# Patient Record
Sex: Female | Born: 1950 | Race: White | Hispanic: No | Marital: Single | Smoking: Never smoker
Health system: Southern US, Community
[De-identification: ages and names within clinical notes are randomized; demographics above are authoritative.]

## PROBLEM LIST (undated history)

## (undated) DIAGNOSIS — A6000 Herpesviral infection of urogenital system, unspecified: Secondary | ICD-10-CM

## (undated) DIAGNOSIS — M25549 Pain in joints of unspecified hand: Secondary | ICD-10-CM

## (undated) DIAGNOSIS — E039 Hypothyroidism, unspecified: Secondary | ICD-10-CM

## (undated) DIAGNOSIS — I10 Essential (primary) hypertension: Secondary | ICD-10-CM

## (undated) DIAGNOSIS — I219 Acute myocardial infarction, unspecified: Secondary | ICD-10-CM

## (undated) HISTORY — DX: Pain in joints of unspecified hand: M25.549

## (undated) HISTORY — DX: Essential (primary) hypertension: I10

## (undated) HISTORY — PX: TONSILLECTOMY AND ADENOIDECTOMY: SUR1326

## (undated) HISTORY — PX: ABDOMINAL HYSTERECTOMY: SHX81

## (undated) HISTORY — DX: Hypothyroidism, unspecified: E03.9

## (undated) HISTORY — DX: Herpesviral infection of urogenital system, unspecified: A60.00

---

## 2015-04-21 DIAGNOSIS — Z1231 Encounter for screening mammogram for malignant neoplasm of breast: Secondary | ICD-10-CM | POA: Diagnosis not present

## 2015-09-20 DIAGNOSIS — Z23 Encounter for immunization: Secondary | ICD-10-CM | POA: Diagnosis not present

## 2015-09-20 DIAGNOSIS — G8929 Other chronic pain: Secondary | ICD-10-CM | POA: Diagnosis not present

## 2015-09-20 DIAGNOSIS — E039 Hypothyroidism, unspecified: Secondary | ICD-10-CM | POA: Diagnosis not present

## 2015-09-20 DIAGNOSIS — Z79899 Other long term (current) drug therapy: Secondary | ICD-10-CM | POA: Diagnosis not present

## 2015-09-20 DIAGNOSIS — A6000 Herpesviral infection of urogenital system, unspecified: Secondary | ICD-10-CM | POA: Diagnosis not present

## 2015-09-20 DIAGNOSIS — M2550 Pain in unspecified joint: Secondary | ICD-10-CM | POA: Diagnosis not present

## 2015-09-20 DIAGNOSIS — Z0000 Encounter for general adult medical examination without abnormal findings: Secondary | ICD-10-CM | POA: Diagnosis not present

## 2016-01-09 DIAGNOSIS — H5213 Myopia, bilateral: Secondary | ICD-10-CM | POA: Diagnosis not present

## 2016-08-06 DIAGNOSIS — L209 Atopic dermatitis, unspecified: Secondary | ICD-10-CM | POA: Diagnosis not present

## 2016-09-20 DIAGNOSIS — M2550 Pain in unspecified joint: Secondary | ICD-10-CM | POA: Diagnosis not present

## 2016-09-20 DIAGNOSIS — Z0000 Encounter for general adult medical examination without abnormal findings: Secondary | ICD-10-CM | POA: Diagnosis not present

## 2016-09-20 DIAGNOSIS — G8929 Other chronic pain: Secondary | ICD-10-CM | POA: Diagnosis not present

## 2016-09-20 DIAGNOSIS — Z23 Encounter for immunization: Secondary | ICD-10-CM | POA: Diagnosis not present

## 2016-09-20 DIAGNOSIS — Z79899 Other long term (current) drug therapy: Secondary | ICD-10-CM | POA: Diagnosis not present

## 2016-09-20 DIAGNOSIS — E039 Hypothyroidism, unspecified: Secondary | ICD-10-CM | POA: Diagnosis not present

## 2016-10-01 DIAGNOSIS — H2513 Age-related nuclear cataract, bilateral: Secondary | ICD-10-CM | POA: Diagnosis not present

## 2016-10-09 DIAGNOSIS — Z1231 Encounter for screening mammogram for malignant neoplasm of breast: Secondary | ICD-10-CM | POA: Diagnosis not present

## 2016-10-22 DIAGNOSIS — M25549 Pain in joints of unspecified hand: Secondary | ICD-10-CM | POA: Diagnosis not present

## 2016-11-14 DIAGNOSIS — H2513 Age-related nuclear cataract, bilateral: Secondary | ICD-10-CM | POA: Diagnosis not present

## 2016-11-14 DIAGNOSIS — Z01818 Encounter for other preprocedural examination: Secondary | ICD-10-CM | POA: Diagnosis not present

## 2016-11-14 DIAGNOSIS — H2511 Age-related nuclear cataract, right eye: Secondary | ICD-10-CM | POA: Diagnosis not present

## 2016-11-14 DIAGNOSIS — H25811 Combined forms of age-related cataract, right eye: Secondary | ICD-10-CM | POA: Diagnosis not present

## 2016-11-25 DIAGNOSIS — H25812 Combined forms of age-related cataract, left eye: Secondary | ICD-10-CM | POA: Diagnosis not present

## 2016-11-25 DIAGNOSIS — H2181 Floppy iris syndrome: Secondary | ICD-10-CM | POA: Diagnosis not present

## 2016-11-25 DIAGNOSIS — H2513 Age-related nuclear cataract, bilateral: Secondary | ICD-10-CM | POA: Diagnosis not present

## 2016-11-25 DIAGNOSIS — H2512 Age-related nuclear cataract, left eye: Secondary | ICD-10-CM | POA: Diagnosis not present

## 2016-12-13 DIAGNOSIS — J189 Pneumonia, unspecified organism: Secondary | ICD-10-CM | POA: Diagnosis not present

## 2017-07-03 DIAGNOSIS — H524 Presbyopia: Secondary | ICD-10-CM | POA: Diagnosis not present

## 2017-07-03 DIAGNOSIS — H4303 Vitreous prolapse, bilateral: Secondary | ICD-10-CM | POA: Diagnosis not present

## 2017-07-11 DIAGNOSIS — Z6824 Body mass index (BMI) 24.0-24.9, adult: Secondary | ICD-10-CM | POA: Diagnosis not present

## 2017-07-11 DIAGNOSIS — Z79899 Other long term (current) drug therapy: Secondary | ICD-10-CM | POA: Diagnosis not present

## 2017-07-11 DIAGNOSIS — R208 Other disturbances of skin sensation: Secondary | ICD-10-CM | POA: Diagnosis not present

## 2017-07-11 DIAGNOSIS — E039 Hypothyroidism, unspecified: Secondary | ICD-10-CM | POA: Diagnosis not present

## 2017-07-23 DIAGNOSIS — Z6824 Body mass index (BMI) 24.0-24.9, adult: Secondary | ICD-10-CM | POA: Diagnosis not present

## 2017-07-23 DIAGNOSIS — R208 Other disturbances of skin sensation: Secondary | ICD-10-CM | POA: Diagnosis not present

## 2017-07-23 DIAGNOSIS — M5431 Sciatica, right side: Secondary | ICD-10-CM | POA: Diagnosis not present

## 2017-08-14 DIAGNOSIS — J209 Acute bronchitis, unspecified: Secondary | ICD-10-CM | POA: Diagnosis not present

## 2017-08-14 DIAGNOSIS — Z6824 Body mass index (BMI) 24.0-24.9, adult: Secondary | ICD-10-CM | POA: Diagnosis not present

## 2017-09-12 DIAGNOSIS — Z23 Encounter for immunization: Secondary | ICD-10-CM | POA: Diagnosis not present

## 2017-10-10 DIAGNOSIS — Z1231 Encounter for screening mammogram for malignant neoplasm of breast: Secondary | ICD-10-CM | POA: Diagnosis not present

## 2017-11-24 DIAGNOSIS — Z6824 Body mass index (BMI) 24.0-24.9, adult: Secondary | ICD-10-CM | POA: Diagnosis not present

## 2017-11-24 DIAGNOSIS — J209 Acute bronchitis, unspecified: Secondary | ICD-10-CM | POA: Diagnosis not present

## 2017-11-24 DIAGNOSIS — J40 Bronchitis, not specified as acute or chronic: Secondary | ICD-10-CM | POA: Diagnosis not present

## 2017-12-04 DIAGNOSIS — J8489 Other specified interstitial pulmonary diseases: Secondary | ICD-10-CM | POA: Diagnosis not present

## 2017-12-04 DIAGNOSIS — J209 Acute bronchitis, unspecified: Secondary | ICD-10-CM | POA: Diagnosis not present

## 2017-12-04 DIAGNOSIS — Z6824 Body mass index (BMI) 24.0-24.9, adult: Secondary | ICD-10-CM | POA: Diagnosis not present

## 2017-12-25 DIAGNOSIS — J40 Bronchitis, not specified as acute or chronic: Secondary | ICD-10-CM | POA: Diagnosis not present

## 2018-01-25 DIAGNOSIS — J40 Bronchitis, not specified as acute or chronic: Secondary | ICD-10-CM | POA: Diagnosis not present

## 2018-02-22 DIAGNOSIS — J40 Bronchitis, not specified as acute or chronic: Secondary | ICD-10-CM | POA: Diagnosis not present

## 2018-03-25 DIAGNOSIS — J40 Bronchitis, not specified as acute or chronic: Secondary | ICD-10-CM | POA: Diagnosis not present

## 2018-04-24 DIAGNOSIS — J40 Bronchitis, not specified as acute or chronic: Secondary | ICD-10-CM | POA: Diagnosis not present

## 2018-05-25 DIAGNOSIS — J40 Bronchitis, not specified as acute or chronic: Secondary | ICD-10-CM | POA: Diagnosis not present

## 2018-06-24 DIAGNOSIS — J40 Bronchitis, not specified as acute or chronic: Secondary | ICD-10-CM | POA: Diagnosis not present

## 2018-07-25 DIAGNOSIS — J40 Bronchitis, not specified as acute or chronic: Secondary | ICD-10-CM | POA: Diagnosis not present

## 2018-08-05 DIAGNOSIS — L578 Other skin changes due to chronic exposure to nonionizing radiation: Secondary | ICD-10-CM | POA: Diagnosis not present

## 2018-08-05 DIAGNOSIS — D485 Neoplasm of uncertain behavior of skin: Secondary | ICD-10-CM | POA: Diagnosis not present

## 2018-08-06 DIAGNOSIS — H524 Presbyopia: Secondary | ICD-10-CM | POA: Diagnosis not present

## 2018-08-06 DIAGNOSIS — H4303 Vitreous prolapse, bilateral: Secondary | ICD-10-CM | POA: Diagnosis not present

## 2018-08-25 DIAGNOSIS — J40 Bronchitis, not specified as acute or chronic: Secondary | ICD-10-CM | POA: Diagnosis not present

## 2018-09-06 DIAGNOSIS — S82192A Other fracture of upper end of left tibia, initial encounter for closed fracture: Secondary | ICD-10-CM | POA: Diagnosis not present

## 2018-09-06 DIAGNOSIS — S82255A Nondisplaced comminuted fracture of shaft of left tibia, initial encounter for closed fracture: Secondary | ICD-10-CM | POA: Diagnosis not present

## 2018-09-06 DIAGNOSIS — S82002A Unspecified fracture of left patella, initial encounter for closed fracture: Secondary | ICD-10-CM | POA: Diagnosis not present

## 2018-09-06 DIAGNOSIS — S82092A Other fracture of left patella, initial encounter for closed fracture: Secondary | ICD-10-CM | POA: Diagnosis not present

## 2018-09-06 DIAGNOSIS — S82102A Unspecified fracture of upper end of left tibia, initial encounter for closed fracture: Secondary | ICD-10-CM | POA: Diagnosis not present

## 2018-09-06 DIAGNOSIS — S82202A Unspecified fracture of shaft of left tibia, initial encounter for closed fracture: Secondary | ICD-10-CM | POA: Diagnosis not present

## 2018-09-07 DIAGNOSIS — S82035A Nondisplaced transverse fracture of left patella, initial encounter for closed fracture: Secondary | ICD-10-CM | POA: Diagnosis not present

## 2018-09-07 DIAGNOSIS — S82192A Other fracture of upper end of left tibia, initial encounter for closed fracture: Secondary | ICD-10-CM | POA: Diagnosis not present

## 2018-09-14 DIAGNOSIS — S82035A Nondisplaced transverse fracture of left patella, initial encounter for closed fracture: Secondary | ICD-10-CM | POA: Diagnosis not present

## 2018-09-14 DIAGNOSIS — S82192A Other fracture of upper end of left tibia, initial encounter for closed fracture: Secondary | ICD-10-CM | POA: Diagnosis not present

## 2018-09-21 DIAGNOSIS — S82192D Other fracture of upper end of left tibia, subsequent encounter for closed fracture with routine healing: Secondary | ICD-10-CM | POA: Diagnosis not present

## 2018-09-21 DIAGNOSIS — S82035D Nondisplaced transverse fracture of left patella, subsequent encounter for closed fracture with routine healing: Secondary | ICD-10-CM | POA: Diagnosis not present

## 2018-10-05 DIAGNOSIS — M85851 Other specified disorders of bone density and structure, right thigh: Secondary | ICD-10-CM | POA: Diagnosis not present

## 2018-10-05 DIAGNOSIS — Z13820 Encounter for screening for osteoporosis: Secondary | ICD-10-CM | POA: Diagnosis not present

## 2018-10-05 DIAGNOSIS — Z23 Encounter for immunization: Secondary | ICD-10-CM | POA: Diagnosis not present

## 2018-10-14 DIAGNOSIS — S82192D Other fracture of upper end of left tibia, subsequent encounter for closed fracture with routine healing: Secondary | ICD-10-CM | POA: Diagnosis not present

## 2018-10-14 DIAGNOSIS — S82035D Nondisplaced transverse fracture of left patella, subsequent encounter for closed fracture with routine healing: Secondary | ICD-10-CM | POA: Diagnosis not present

## 2018-10-20 DIAGNOSIS — M25562 Pain in left knee: Secondary | ICD-10-CM | POA: Diagnosis not present

## 2018-10-20 DIAGNOSIS — R262 Difficulty in walking, not elsewhere classified: Secondary | ICD-10-CM | POA: Diagnosis not present

## 2018-10-22 DIAGNOSIS — M25562 Pain in left knee: Secondary | ICD-10-CM | POA: Diagnosis not present

## 2018-10-22 DIAGNOSIS — R262 Difficulty in walking, not elsewhere classified: Secondary | ICD-10-CM | POA: Diagnosis not present

## 2018-10-26 DIAGNOSIS — R262 Difficulty in walking, not elsewhere classified: Secondary | ICD-10-CM | POA: Diagnosis not present

## 2018-10-26 DIAGNOSIS — M25562 Pain in left knee: Secondary | ICD-10-CM | POA: Diagnosis not present

## 2018-10-29 DIAGNOSIS — R262 Difficulty in walking, not elsewhere classified: Secondary | ICD-10-CM | POA: Diagnosis not present

## 2018-10-29 DIAGNOSIS — M25562 Pain in left knee: Secondary | ICD-10-CM | POA: Diagnosis not present

## 2018-11-02 DIAGNOSIS — R262 Difficulty in walking, not elsewhere classified: Secondary | ICD-10-CM | POA: Diagnosis not present

## 2018-11-02 DIAGNOSIS — M25562 Pain in left knee: Secondary | ICD-10-CM | POA: Diagnosis not present

## 2018-11-06 DIAGNOSIS — M25562 Pain in left knee: Secondary | ICD-10-CM | POA: Diagnosis not present

## 2018-11-06 DIAGNOSIS — R262 Difficulty in walking, not elsewhere classified: Secondary | ICD-10-CM | POA: Diagnosis not present

## 2018-11-09 DIAGNOSIS — R262 Difficulty in walking, not elsewhere classified: Secondary | ICD-10-CM | POA: Diagnosis not present

## 2018-11-09 DIAGNOSIS — M25562 Pain in left knee: Secondary | ICD-10-CM | POA: Diagnosis not present

## 2018-11-09 DIAGNOSIS — S82192D Other fracture of upper end of left tibia, subsequent encounter for closed fracture with routine healing: Secondary | ICD-10-CM | POA: Diagnosis not present

## 2018-11-09 DIAGNOSIS — S82035D Nondisplaced transverse fracture of left patella, subsequent encounter for closed fracture with routine healing: Secondary | ICD-10-CM | POA: Diagnosis not present

## 2018-11-09 DIAGNOSIS — M1732 Unilateral post-traumatic osteoarthritis, left knee: Secondary | ICD-10-CM | POA: Diagnosis not present

## 2018-11-12 DIAGNOSIS — M25562 Pain in left knee: Secondary | ICD-10-CM | POA: Diagnosis not present

## 2018-11-12 DIAGNOSIS — R262 Difficulty in walking, not elsewhere classified: Secondary | ICD-10-CM | POA: Diagnosis not present

## 2018-11-16 DIAGNOSIS — R262 Difficulty in walking, not elsewhere classified: Secondary | ICD-10-CM | POA: Diagnosis not present

## 2018-11-16 DIAGNOSIS — M25562 Pain in left knee: Secondary | ICD-10-CM | POA: Diagnosis not present

## 2018-11-19 DIAGNOSIS — R262 Difficulty in walking, not elsewhere classified: Secondary | ICD-10-CM | POA: Diagnosis not present

## 2018-11-19 DIAGNOSIS — M25562 Pain in left knee: Secondary | ICD-10-CM | POA: Diagnosis not present

## 2018-11-24 DIAGNOSIS — R262 Difficulty in walking, not elsewhere classified: Secondary | ICD-10-CM | POA: Diagnosis not present

## 2018-11-24 DIAGNOSIS — M25562 Pain in left knee: Secondary | ICD-10-CM | POA: Diagnosis not present

## 2018-11-27 DIAGNOSIS — R262 Difficulty in walking, not elsewhere classified: Secondary | ICD-10-CM | POA: Diagnosis not present

## 2018-11-27 DIAGNOSIS — M25562 Pain in left knee: Secondary | ICD-10-CM | POA: Diagnosis not present

## 2018-11-30 DIAGNOSIS — R262 Difficulty in walking, not elsewhere classified: Secondary | ICD-10-CM | POA: Diagnosis not present

## 2018-11-30 DIAGNOSIS — M25562 Pain in left knee: Secondary | ICD-10-CM | POA: Diagnosis not present

## 2018-12-03 DIAGNOSIS — M25562 Pain in left knee: Secondary | ICD-10-CM | POA: Diagnosis not present

## 2018-12-03 DIAGNOSIS — R262 Difficulty in walking, not elsewhere classified: Secondary | ICD-10-CM | POA: Diagnosis not present

## 2018-12-08 DIAGNOSIS — M25562 Pain in left knee: Secondary | ICD-10-CM | POA: Diagnosis not present

## 2018-12-08 DIAGNOSIS — R262 Difficulty in walking, not elsewhere classified: Secondary | ICD-10-CM | POA: Diagnosis not present

## 2018-12-10 DIAGNOSIS — M25562 Pain in left knee: Secondary | ICD-10-CM | POA: Diagnosis not present

## 2018-12-10 DIAGNOSIS — R262 Difficulty in walking, not elsewhere classified: Secondary | ICD-10-CM | POA: Diagnosis not present

## 2018-12-22 DIAGNOSIS — M25562 Pain in left knee: Secondary | ICD-10-CM | POA: Diagnosis not present

## 2018-12-22 DIAGNOSIS — R262 Difficulty in walking, not elsewhere classified: Secondary | ICD-10-CM | POA: Diagnosis not present

## 2018-12-24 DIAGNOSIS — M1732 Unilateral post-traumatic osteoarthritis, left knee: Secondary | ICD-10-CM | POA: Diagnosis not present

## 2018-12-24 DIAGNOSIS — S82192D Other fracture of upper end of left tibia, subsequent encounter for closed fracture with routine healing: Secondary | ICD-10-CM | POA: Diagnosis not present

## 2018-12-24 DIAGNOSIS — S82035D Nondisplaced transverse fracture of left patella, subsequent encounter for closed fracture with routine healing: Secondary | ICD-10-CM | POA: Diagnosis not present

## 2018-12-26 DIAGNOSIS — Z1231 Encounter for screening mammogram for malignant neoplasm of breast: Secondary | ICD-10-CM | POA: Diagnosis not present

## 2018-12-29 DIAGNOSIS — R8271 Bacteriuria: Secondary | ICD-10-CM | POA: Diagnosis not present

## 2018-12-29 DIAGNOSIS — E039 Hypothyroidism, unspecified: Secondary | ICD-10-CM | POA: Diagnosis not present

## 2018-12-29 DIAGNOSIS — R262 Difficulty in walking, not elsewhere classified: Secondary | ICD-10-CM | POA: Diagnosis not present

## 2018-12-29 DIAGNOSIS — E785 Hyperlipidemia, unspecified: Secondary | ICD-10-CM | POA: Diagnosis not present

## 2018-12-29 DIAGNOSIS — M25562 Pain in left knee: Secondary | ICD-10-CM | POA: Diagnosis not present

## 2018-12-29 DIAGNOSIS — E7800 Pure hypercholesterolemia, unspecified: Secondary | ICD-10-CM | POA: Diagnosis not present

## 2018-12-29 DIAGNOSIS — I10 Essential (primary) hypertension: Secondary | ICD-10-CM | POA: Diagnosis not present

## 2018-12-29 DIAGNOSIS — Z0000 Encounter for general adult medical examination without abnormal findings: Secondary | ICD-10-CM | POA: Diagnosis not present

## 2018-12-29 DIAGNOSIS — Z79899 Other long term (current) drug therapy: Secondary | ICD-10-CM | POA: Diagnosis not present

## 2019-01-14 DIAGNOSIS — Z6823 Body mass index (BMI) 23.0-23.9, adult: Secondary | ICD-10-CM | POA: Diagnosis not present

## 2019-01-14 DIAGNOSIS — J22 Unspecified acute lower respiratory infection: Secondary | ICD-10-CM | POA: Diagnosis not present

## 2019-01-28 DIAGNOSIS — Z6823 Body mass index (BMI) 23.0-23.9, adult: Secondary | ICD-10-CM | POA: Diagnosis not present

## 2019-03-22 DIAGNOSIS — Z6822 Body mass index (BMI) 22.0-22.9, adult: Secondary | ICD-10-CM | POA: Diagnosis not present

## 2019-03-22 DIAGNOSIS — R9389 Abnormal findings on diagnostic imaging of other specified body structures: Secondary | ICD-10-CM | POA: Diagnosis not present

## 2019-03-22 DIAGNOSIS — J301 Allergic rhinitis due to pollen: Secondary | ICD-10-CM | POA: Diagnosis not present

## 2019-09-03 DIAGNOSIS — E785 Hyperlipidemia, unspecified: Secondary | ICD-10-CM | POA: Diagnosis not present

## 2019-09-03 DIAGNOSIS — Z79899 Other long term (current) drug therapy: Secondary | ICD-10-CM | POA: Diagnosis not present

## 2019-09-03 DIAGNOSIS — E039 Hypothyroidism, unspecified: Secondary | ICD-10-CM | POA: Diagnosis not present

## 2019-09-14 DIAGNOSIS — E039 Hypothyroidism, unspecified: Secondary | ICD-10-CM | POA: Diagnosis not present

## 2019-09-14 DIAGNOSIS — Z23 Encounter for immunization: Secondary | ICD-10-CM | POA: Diagnosis not present

## 2019-09-14 DIAGNOSIS — Z6823 Body mass index (BMI) 23.0-23.9, adult: Secondary | ICD-10-CM | POA: Diagnosis not present

## 2019-09-14 DIAGNOSIS — I10 Essential (primary) hypertension: Secondary | ICD-10-CM | POA: Diagnosis not present

## 2020-01-24 DIAGNOSIS — R0789 Other chest pain: Secondary | ICD-10-CM | POA: Diagnosis not present

## 2020-01-24 DIAGNOSIS — R9431 Abnormal electrocardiogram [ECG] [EKG]: Secondary | ICD-10-CM | POA: Diagnosis not present

## 2020-01-24 DIAGNOSIS — Z6824 Body mass index (BMI) 24.0-24.9, adult: Secondary | ICD-10-CM | POA: Diagnosis not present

## 2020-01-26 ENCOUNTER — Other Ambulatory Visit: Payer: Self-pay

## 2020-01-26 ENCOUNTER — Ambulatory Visit (INDEPENDENT_AMBULATORY_CARE_PROVIDER_SITE_OTHER): Payer: Medicare Other | Admitting: Cardiology

## 2020-01-26 ENCOUNTER — Encounter: Payer: Self-pay | Admitting: Cardiology

## 2020-01-26 ENCOUNTER — Encounter: Payer: Self-pay | Admitting: *Deleted

## 2020-01-26 VITALS — BP 150/90 | HR 84 | Ht 62.5 in | Wt 134.0 lb

## 2020-01-26 DIAGNOSIS — I10 Essential (primary) hypertension: Secondary | ICD-10-CM | POA: Diagnosis not present

## 2020-01-26 DIAGNOSIS — R072 Precordial pain: Secondary | ICD-10-CM | POA: Insufficient documentation

## 2020-01-26 DIAGNOSIS — M25549 Pain in joints of unspecified hand: Secondary | ICD-10-CM | POA: Insufficient documentation

## 2020-01-26 DIAGNOSIS — E039 Hypothyroidism, unspecified: Secondary | ICD-10-CM | POA: Insufficient documentation

## 2020-01-26 DIAGNOSIS — A6000 Herpesviral infection of urogenital system, unspecified: Secondary | ICD-10-CM

## 2020-01-26 DIAGNOSIS — R011 Cardiac murmur, unspecified: Secondary | ICD-10-CM | POA: Insufficient documentation

## 2020-01-26 DIAGNOSIS — E782 Mixed hyperlipidemia: Secondary | ICD-10-CM

## 2020-01-26 HISTORY — DX: Mixed hyperlipidemia: E78.2

## 2020-01-26 HISTORY — DX: Cardiac murmur, unspecified: R01.1

## 2020-01-26 HISTORY — DX: Precordial pain: R07.2

## 2020-01-26 MED ORDER — NITROGLYCERIN 0.4 MG SL SUBL: 0.4000 mg | SUBLINGUAL_TABLET | SUBLINGUAL | 3 refills | Status: DC | PRN

## 2020-01-26 NOTE — Patient Instructions (Signed)
Medication Instructions:  Your physician has recommended you make the following change in your medication:   Nitroglycerin 0.4 mg sublingual (under your tongue) as needed for chest pain. If experiencing chest pain, stop what you are doing and sit down. Take 1 nitroglycerin and wait 5 minutes. If chest pain continues, take another nitroglycerin and wait 5 minutes. If chest pain does not subside, take 1 more nitroglycerin and dial 911. You make take a total of 3 nitroglycerin in a 15 minute time frame.    *If you need a refill on your cardiac medications before your next appointment, please call your pharmacy*  Lab Work: None If you have labs (blood work) drawn today and your tests are completely normal, you will receive your results only by: Marland Kitchen MyChart Message (if you have MyChart) OR . A paper copy in the mail If you have any lab test that is abnormal or we need to change your treatment, we will call you to review the results.  Testing/Procedures: Your physician has requested that you have an echocardiogram. Echocardiography is a painless test that uses sound waves to create images of your heart. It provides your doctor with information about the size and shape of your heart and how well your heart's chambers and valves are working. This procedure takes approximately one hour. There are no restrictions for this procedure.  Your physician has requested that you have a lexiscan myoview. For further information please visit [email or web address]. Please follow instruction sheet, as given.    Follow-Up: At [facility], you and your health needs are our priority.  As part of our continuing mission to provide you with exceptional heart care, we have created designated Provider Care Teams.  These Care Teams include your primary Cardiologist (physician) and Advanced Practice Providers (APPs -  Physician Assistants and Nurse Practitioners) who all work together to provide you with the care you need,  when you need it.  Your next appointment:   1 month(s)  The format for your next appointment:   In Person  Provider:   Berniece Salines, DO  Other Instructions

## 2020-01-26 NOTE — Progress Notes (Addendum)
Cardiology Office Note:    Date:  01/26/2020   ID:  Sharon Miranda, DOB 1951/06/20, MRN [id number]  PCP:  Mateo Flow, MD  Cardiologist:  No primary care provider on file.  Electrophysiologist:  None   Referring MD: Mateo Flow, MD   Chief Complaint  Patient presents with  . Chest Pain    History of Present Illness:    Sharon Miranda is a 69 y.o. female with a hx of hypertension, hyperlipidemia, hypothyroidism and family history of premature coronary artery disease presented today at the request of her primary care doctor to be evaluated for chest pain.  The patient tells me that she has experienced intermittent episodes of chest pain which started earlier this week.  She described as a left-sided electrical sensation.  She denies any radiation.  She tells me this episode lasted for about few seconds prior to resolution.  She denies any associated shortness of breath, any nausea, any vomiting.  I was able to review the patient PCP note independently-she did see her PCP on January 24, 2020 at which time she was recommended to see cardiology.   Past Medical History:  Diagnosis Date  . Adult hypothyroidism   . Arthralgia of hand   . Benign essential HTN   . Recurrent genital herpes     Past Surgical History:  Procedure Laterality Date  . ABDOMINAL HYSTERECTOMY     due to endometriosis  . APPENDECTOMY  1996  . CHOLECYSTECTOMY  2008  . TONSILLECTOMY AND ADENOIDECTOMY      Current Medications: Current Meds  Medication Sig  . acyclovir (ZOVIRAX) 400 MG tablet Take 400 mg by mouth 2 (two) times daily.  Marland Kitchen amLODipine (NORVASC) 2.5 MG tablet Take 2.5 mg by mouth daily.  Marland Kitchen aspirin EC 81 MG tablet Take 81 mg by mouth daily.  Marland Kitchen levothyroxine (SYNTHROID) 137 MCG tablet Take 137 mcg by mouth daily.     Allergies:   Codeine sulfate [codeine]   Social History   Socioeconomic History  . Marital status: Single    Spouse name: Not on file  . Number of children: Not on file  .  Years of education: Not on file  . Highest education level: Not on file  Occupational History  . Not on file  Tobacco Use  . Smoking status: Never Smoker  . Smokeless tobacco: Never Used  Substance and Sexual Activity  . Alcohol use: Not Currently  . Drug use: Never  . Sexual activity: Not on file  Other Topics Concern  . Not on file  Social History Narrative  . Not on file   Social Determinants of Health   Financial Resource Strain:   . Difficulty of Paying Living Expenses: Not on file  Food Insecurity:   . Worried About Charity fundraiser in the Last Year: Not on file  . Ran Out of Food in the Last Year: Not on file  Transportation Needs:   . Lack of Transportation (Medical): Not on file  . Lack of Transportation (Non-Medical): Not on file  Physical Activity:   . Days of Exercise per Week: Not on file  . Minutes of Exercise per Session: Not on file  Stress:   . Feeling of Stress : Not on file  Social Connections:   . Frequency of Communication with Friends and Family: Not on file  . Frequency of Social Gatherings with Friends and Family: Not on file  . Attends Religious Services: Not on file  . Active Member  of Clubs or Organizations: Not on file  . Attends Archivist Meetings: Not on file  . Marital Status: Not on file     Family History: The patient's family history includes Addison's disease in her brother; COPD in her brother; Heart attack in her brother, brother, and mother; Leukemia in her brother.  ROS:   Review of Systems  Constitution: Negative for decreased appetite, fever and weight gain.  HENT: Negative for congestion, ear discharge, hoarse voice and sore throat.   Eyes: Negative for discharge, redness, vision loss in right eye and visual halos.  Cardiovascular: Reports chest pain.negative for dyspnea on exertion, leg swelling, orthopnea and palpitations.  Respiratory: Negative for cough, hemoptysis, shortness of breath and snoring.     Endocrine: Negative for heat intolerance and polyphagia.  Hematologic/Lymphatic: Negative for bleeding problem. Does not bruise/bleed easily.  Skin: Negative for flushing, nail changes, rash and suspicious lesions.  Musculoskeletal: Negative for arthritis, joint pain, muscle cramps, myalgias, neck pain and stiffness.  Gastrointestinal: Negative for abdominal pain, bowel incontinence, diarrhea and excessive appetite.  Genitourinary: Negative for decreased libido, genital sores and incomplete emptying.  Neurological: Negative for brief paralysis, focal weakness, headaches and loss of balance.  Psychiatric/Behavioral: Negative for altered mental status, depression and suicidal ideas.  Allergic/Immunologic: Negative for HIV exposure and persistent infections.    EKGs/Labs/Other Studies Reviewed:    The following studies were reviewed today:   EKG:  The ekg ordered today demonstrates sinus rhythm, heart rate 81 bpm with left axis deviation compared to EKG done at her PCP office on January 24, 2020 no significant change.   Recent Labs: Blood work done at her PCP office on September 03, 2019 reviewed by me: CBC: WBC 5.4, hemoglobin 14.5, hematocrit 43, platelets 235 Chemistry: Glucose 97, BUN 13, creatinine 0.7, total bili 0.5, AST 21, ALT 16, alk phos 92, calcium 9.4, sodium 139, potassium 4.0, chloride 104, bicarb 27, total protein 6.6, albumin 4.4, globulin 2.2.  Recent Lipid Panel Lipid profile done by her PCP on September 03, 2019 Triglycerides 35, total cholesterol 184, HDL 66, LDL 111 Physical Exam:    VS:  BP (!) 150/90 (BP Location: Left Arm, Patient Position: Sitting, Cuff Size: Normal)   Pulse 84   Ht 5' 2.5" (1.588 m)   Wt 134 lb (60.8 kg)   SpO2 96%   BMI 24.12 kg/m     Wt Readings from Last 3 Encounters:  01/26/20 134 lb (60.8 kg)     GEN: Well nourished, well developed in no acute distress HEENT: Normal NECK: No JVD; No carotid bruits LYMPHATICS: No  lymphadenopathy CARDIAC: S1S2 YDXAJ,OIN,8/6 systolic murmurs, rubs, gallops RESPIRATORY:  Clear to auscultation without rales, wheezing or rhonchi  ABDOMEN: Soft, non-tender, non-distended, +bowel sounds, no guarding. EXTREMITIES: No edema, No cyanosis, no clubbing MUSCULOSKELETAL:  No deformity  SKIN: Warm and dry NEUROLOGIC:  Alert and oriented x 3, non-focal PSYCHIATRIC:  Normal affect, good insight  ASSESSMENT:    1. Essential hypertension   2. Precordial pain   3. Murmur   4. Mixed hyperlipidemia    PLAN:    The patient chest pain is concerning given her risk factors.  At this time is appropriate to proceed with ischemic evaluation.  Discussed with the patient about the pharmacologic nuclear stress that she is willing to proceed with this testing.  All of her questions were answered during her office visit.  Sublingual nitroglycerin prescription was sent, its protocol and 911 protocol explained and the patient vocalized  understanding questions were answered to the patient's satisfaction.  She is hypertensive in the office today manually 150/90 mmHg, during my review of her PCP office notes from February 15 at which time she was 122/82 mmHg.  The patient tells me that she has not had any blood pressure which is just elevated.  Therefore at this time I am going to hold off on adding any additional antihypertensive to her already taking amlodipine 2.5 mg to avoid potential hypotension.  In the meantime she does not have a blood pressure cuff at home therefore with giving her an automatic arm blood pressure cuff where she would be able to take her blood pressure daily and bring this information to her next office visit.  In the setting of her physical exam with a murmur a 2D echo has been ordered to assess for any valvular abnormalities, this will also help me to review if there is any left ventricular wall thickness and other structural abnormalities.  Hyperlipidemia-LDL 111 at time the  patient prefers to do the medication is not unreasonable we will repeat her lipid profile in 3 months.  The patient is in agreement with the above plan. The patient left the office in stable condition.  The patient will follow up in 1 month due to blood pressure check.  Total visit time 35 minutes.   Medication Adjustments/Labs and Tests Ordered: Current medicines are reviewed at length with the patient today.  Concerns regarding medicines are outlined above.  Orders Placed This Encounter  Procedures  . MYOCARDIAL PERFUSION IMAGING  . EKG 12-Lead  . ECHOCARDIOGRAM COMPLETE   Meds ordered this encounter  Medications  . nitroGLYCERIN (NITROSTAT) 0.4 MG SL tablet    Sig: Place 1 tablet (0.4 mg total) under the tongue every 5 (five) minutes as needed.    Dispense:  30 tablet    Refill:  3    Patient Instructions  Medication Instructions:  Your physician has recommended you make the following change in your medication:   Nitroglycerin 0.4 mg sublingual (under your tongue) as needed for chest pain. If experiencing chest pain, stop what you are doing and sit down. Take 1 nitroglycerin and wait 5 minutes. If chest pain continues, take another nitroglycerin and wait 5 minutes. If chest pain does not subside, take 1 more nitroglycerin and dial 911. You make take a total of 3 nitroglycerin in a 15 minute time frame.    *If you need a refill on your cardiac medications before your next appointment, please call your pharmacy*  Lab Work: None If you have labs (blood work) drawn today and your tests are completely normal, you will receive your results only by: Marland Kitchen MyChart Message (if you have MyChart) OR . A paper copy in the mail If you have any lab test that is abnormal or we need to change your treatment, we will call you to review the results.  Testing/Procedures: Your physician has requested that you have an echocardiogram. Echocardiography is a painless test that uses sound waves to  create images of your heart. It provides your doctor with information about the size and shape of your heart and how well your heart's chambers and valves are working. This procedure takes approximately one hour. There are no restrictions for this procedure.  Your physician has requested that you have a lexiscan myoview. For further information please visit [email or web address]. Please follow instruction sheet, as given.    Follow-Up: At [facility], you and your health needs are our priority.  As part of our continuing mission to provide you with exceptional heart care, we have created designated Provider Care Teams.  These Care Teams include your primary Cardiologist (physician) and Advanced Practice Providers (APPs -  Physician Assistants and Nurse Practitioners) who all work together to provide you with the care you need, when you need it.  Your next appointment:   1 month(s)  The format for your next appointment:   In Person  Provider:   Berniece Salines, DO  Other Instructions      Adopting a Healthy Lifestyle.  Know what a healthy weight is for you (roughly BMI <25) and aim to maintain this   Aim for 7+ servings of fruits and vegetables daily   65-80+ fluid ounces of water or unsweet tea for healthy kidneys   Limit to max 1 drink of alcohol per day; avoid smoking/tobacco   Limit animal fats in diet for cholesterol and heart health - choose grass fed whenever available   Avoid highly processed foods, and foods high in saturated/trans fats   Aim for low stress - take time to unwind and care for your mental health   Aim for 150 min of moderate intensity exercise weekly for heart health, and weights twice weekly for bone health   Aim for 7-9 hours of sleep daily   When it comes to diets, agreement about the perfect plan isnt easy to find, even among the experts. Experts at the [employer] developed an idea known as the Healthy Eating Plate. Just  imagine a plate divided into logical, healthy portions.   The emphasis is on diet quality:   Load up on vegetables and fruits - one-half of your plate: Aim for color and variety, and remember that potatoes dont count.   Go for whole grains - one-quarter of your plate: Whole wheat, barley, wheat berries, quinoa, oats, brown rice, and foods made with them. If you want pasta, go with whole wheat pasta.   Protein power - one-quarter of your plate: Fish, chicken, beans, and nuts are all healthy, versatile protein sources. Limit red meat.   The diet, however, does go beyond the plate, offering a few other suggestions.   Use healthy plant oils, such as olive, canola, soy, corn, sunflower and peanut. Check the labels, and avoid partially hydrogenated oil, which have unhealthy trans fats.   If youre thirsty, drink water. Coffee and tea are good in moderation, but skip sugary drinks and limit milk and dairy products to one or two daily servings.   The type of carbohydrate in the diet is more important than the amount. Some sources of carbohydrates, such as vegetables, fruits, whole grains, and beans-are healthier than others.   Finally, stay active  Signed, Berniece Salines, DO  01/26/2020 10:00 PM    [place]

## 2020-01-27 ENCOUNTER — Telehealth: Payer: Self-pay | Admitting: Cardiology

## 2020-01-27 DIAGNOSIS — Z6824 Body mass index (BMI) 24.0-24.9, adult: Secondary | ICD-10-CM | POA: Diagnosis not present

## 2020-01-27 DIAGNOSIS — I10 Essential (primary) hypertension: Secondary | ICD-10-CM | POA: Diagnosis not present

## 2020-01-27 MED ORDER — HYDROCHLOROTHIAZIDE 12.5 MG PO CAPS: 12.5000 mg | ORAL_CAPSULE | Freq: Every day | ORAL | 3 refills | Status: DC

## 2020-01-27 MED ORDER — AMLODIPINE BESYLATE 10 MG PO TABS: 10.0000 mg | ORAL_TABLET | Freq: Every day | ORAL | 3 refills | Status: DC

## 2020-01-27 NOTE — Telephone Encounter (Signed)
Please see if I can see her next week. I think I have some time on next Friday.  In the meantime have take HCTZ 12.5mg  in addition with her Amlodipine can be increased to 10 mg daily.

## 2020-01-27 NOTE — Telephone Encounter (Signed)
Patient to be seen by PCP at 2:40pm.

## 2020-01-27 NOTE — Telephone Encounter (Signed)
  Pt c/o BP issue: STAT if pt c/o blurred vision, one-sided weakness or slurred speech  1. What are your last 5 BP readings? 167/100 this morning  2. Are you having any other symptoms (ex. Dizziness, headache, blurred vision, passed out)? Palpitations off and on, not constant  3. What is your BP issue? Patient states she has had some palpitations that come and go. Patient denies any chest pain or discomfort. Denies any other symptoms. She states her BP was 167/100 this morning and she called her PCP who told her to take a double dose of her BP meds which she did. She states it doesn't seem to have helped but it is only been an hour since she took it. Please advise.

## 2020-01-27 NOTE — Addendum Note (Signed)
Addended by: Sherrie Mustache on: 01/27/2020 02:45 PM   Modules accepted: Orders

## 2020-01-27 NOTE — Telephone Encounter (Signed)
Spoke with patient. Patient has been having palpitations where she feels like her heart is racing. Unable to give current heart rate over the phone. Patient's blood pressure was 167/100 this morning so she called her PCP who advised her to take an additional dose of Norvasc. BP was 198/112 when repeated after taking more Norvasc. Patient reports her left arm is "heavy", unable to describe further.  Patient calling PCP to see if she can be seen today. Advised patient if she cannot be seen she needs to be evaluated by the ED.   Will also route to Dr. Harriet Masson.

## 2020-01-27 NOTE — Telephone Encounter (Signed)
Left message on patient's cell phone per patient request. HCTZ 12.5mg  daily and Amlodipine 10mg  daily sent into patient's pharmacy. Patient asked to call back and schedule follow up appointment with Dr. Harriet Masson for next week.

## 2020-01-31 ENCOUNTER — Other Ambulatory Visit: Payer: Self-pay

## 2020-01-31 ENCOUNTER — Ambulatory Visit (INDEPENDENT_AMBULATORY_CARE_PROVIDER_SITE_OTHER): Payer: Medicare Other | Admitting: Cardiology

## 2020-01-31 ENCOUNTER — Encounter: Payer: Self-pay | Admitting: Cardiology

## 2020-01-31 VITALS — BP 132/78 | HR 92 | Ht 62.0 in | Wt 133.0 lb

## 2020-01-31 DIAGNOSIS — E782 Mixed hyperlipidemia: Secondary | ICD-10-CM

## 2020-01-31 DIAGNOSIS — I10 Essential (primary) hypertension: Secondary | ICD-10-CM

## 2020-01-31 DIAGNOSIS — R079 Chest pain, unspecified: Secondary | ICD-10-CM | POA: Diagnosis not present

## 2020-01-31 NOTE — Progress Notes (Signed)
Cardiology Office Note:    Date:  01/31/2020   ID:  Sharon Miranda, DOB 06-Dec-1951, MRN [id number]  PCP:  Mateo Flow, MD  Cardiologist:  Berniece Salines, DO  Electrophysiologist:  None   Referring MD: Mateo Flow, MD   Follow up visit  History of Present Illness:    Sharon Miranda is a 69 y.o. female with a hx o hypertension, lipidemia, recently visited my office due to chest pain.  During time of her visit the patient tells me that her blood pressure had been normal previously but manually in the office was 150/90 mmHg.  We did give the patient a blood pressure cuff to be able to take her blood pressure daily at home which she reported last Friday to be averaging systolics of 123456 and diastolics in the [id number].  I therefore increase the patient amlodipine to 10 mg daily and started her also on hydrochlorothiazide 12.5 mg daily.  She is here today for follow-up visit.  Past Medical History:  Diagnosis Date  . Adult hypothyroidism   . Arthralgia of hand   . Benign essential HTN   . Recurrent genital herpes     Past Surgical History:  Procedure Laterality Date  . ABDOMINAL HYSTERECTOMY     due to endometriosis  . APPENDECTOMY  1996  . CHOLECYSTECTOMY  2008  . TONSILLECTOMY AND ADENOIDECTOMY      Current Medications: Current Meds  Medication Sig  . amLODipine (NORVASC) 10 MG tablet Take 1 tablet (10 mg total) by mouth daily.  Marland Kitchen aspirin EC 81 MG tablet Take 81 mg by mouth daily.  . hydrochlorothiazide (MICROZIDE) 12.5 MG capsule Take 1 capsule (12.5 mg total) by mouth daily.  Marland Kitchen levothyroxine (SYNTHROID) 137 MCG tablet Take 137 mcg by mouth daily.  . nitroGLYCERIN (NITROSTAT) 0.4 MG SL tablet Place 1 tablet (0.4 mg total) under the tongue every 5 (five) minutes as needed.     Allergies:   Codeine sulfate [codeine]   Social History   Socioeconomic History  . Marital status: Single    Spouse name: Not on file  . Number of children: Not on file  . Years of education: Not  on file  . Highest education level: Not on file  Occupational History  . Not on file  Tobacco Use  . Smoking status: Never Smoker  . Smokeless tobacco: Never Used  Substance and Sexual Activity  . Alcohol use: Not Currently  . Drug use: Never  . Sexual activity: Not on file  Other Topics Concern  . Not on file  Social History Narrative  . Not on file   Social Determinants of Health   Financial Resource Strain:   . Difficulty of Paying Living Expenses: Not on file  Food Insecurity:   . Worried About Charity fundraiser in the Last Year: Not on file  . Ran Out of Food in the Last Year: Not on file  Transportation Needs:   . Lack of Transportation (Medical): Not on file  . Lack of Transportation (Non-Medical): Not on file  Physical Activity:   . Days of Exercise per Week: Not on file  . Minutes of Exercise per Session: Not on file  Stress:   . Feeling of Stress : Not on file  Social Connections:   . Frequency of Communication with Friends and Family: Not on file  . Frequency of Social Gatherings with Friends and Family: Not on file  . Attends Religious Services: Not on file  . Active  Member of Clubs or Organizations: Not on file  . Attends Archivist Meetings: Not on file  . Marital Status: Not on file     Family History: The patient's family history includes Addison's disease in her brother; COPD in her brother; Heart attack in her brother, brother, and mother; Leukemia in her brother.  ROS:   Review of Systems  Constitution: Negative for decreased appetite, fever and weight gain.  HENT: Negative for congestion, ear discharge, hoarse voice and sore throat.   Eyes: Negative for discharge, redness, vision loss in right eye and visual halos.  Cardiovascular: Negative for chest pain, dyspnea on exertion, leg swelling, orthopnea and palpitations.  Respiratory: Negative for cough, hemoptysis, shortness of breath and snoring.   Endocrine: Negative for heat  intolerance and polyphagia.  Hematologic/Lymphatic: Negative for bleeding problem. Does not bruise/bleed easily.  Skin: Negative for flushing, nail changes, rash and suspicious lesions.  Musculoskeletal: Negative for arthritis, joint pain, muscle cramps, myalgias, neck pain and stiffness.  Gastrointestinal: Negative for abdominal pain, bowel incontinence, diarrhea and excessive appetite.  Genitourinary: Negative for decreased libido, genital sores and incomplete emptying.  Neurological: Negative for brief paralysis, focal weakness, headaches and loss of balance.  Psychiatric/Behavioral: Negative for altered mental status, depression and suicidal ideas.  Allergic/Immunologic: Negative for HIV exposure and persistent infections.    EKGs/Labs/Other Studies Reviewed:    The following studies were reviewed today:   EKG: None today.  Recent Labs: No results found for requested labs within last 8760 hours.  Recent Lipid Panel No results found for: CHOL, TRIG, HDL, CHOLHDL, VLDL, LDLCALC, LDLDIRECT  Physical Exam:    VS:  BP 132/78   Pulse 92   Ht 5\' 2"  (1.575 m)   Wt 133 lb (60.3 kg)   BMI 24.33 kg/m     Wt Readings from Last 3 Encounters:  01/31/20 133 lb (60.3 kg)  01/26/20 134 lb (60.8 kg)     GEN: Well nourished, well developed in no acute distress HEENT: Normal NECK: No JVD; No carotid bruits LYMPHATICS: No lymphadenopathy CARDIAC: S1S2 noted,RRR, no murmurs, rubs, gallops RESPIRATORY:  Clear to auscultation without rales, wheezing or rhonchi  ABDOMEN: Soft, non-tender, non-distended, +bowel sounds, no guarding. EXTREMITIES: No edema, No cyanosis, no clubbing MUSCULOSKELETAL:  No deformity  SKIN: Warm and dry NEUROLOGIC:  Alert and oriented x 3, non-focal PSYCHIATRIC:  Normal affect, good insight  ASSESSMENT:    1. Essential hypertension   2. Mixed hyperlipidemia   3. Chest pain of uncertain etiology    PLAN:    She has been taking her medication as prescribed.   Her blood pressure is improving.  She will continue her current antihypertensive regimen of amlodipine 10 mg daily as well as hydrochlorothiazide 12.5 mg daily.  Her pharmacologic nuclear stress test and echocardiogram is still pending.  Hyperlipidemia we will continue patient on her current diet modification.  Repeat lipid profile in 3 months.   The patient is in agreement with the above plan. The patient left the office in stable condition.  The patient will follow up in 1 month for blood pressure check.   Medication Adjustments/Labs and Tests Ordered: Current medicines are reviewed at length with the patient today.  Concerns regarding medicines are outlined above.  No orders of the defined types were placed in this encounter.  No orders of the defined types were placed in this encounter.   Patient Instructions  Medication Instructions:  Your physician recommends that you continue on your current medications  as directed. Please refer to the Current Medication list given to you today.  *If you need a refill on your cardiac medications before your next appointment, please call your pharmacy*  Lab Work: None If you have labs (blood work) drawn today and your tests are completely normal, you will receive your results only by: Marland Kitchen MyChart Message (if you have MyChart) OR . A paper copy in the mail If you have any lab test that is abnormal or we need to change your treatment, we will call you to review the results.  Testing/Procedures: None  Follow-Up: At [facility], you and your health needs are our priority.  As part of our continuing mission to provide you with exceptional heart care, we have created designated Provider Care Teams.  These Care Teams include your primary Cardiologist (physician) and Advanced Practice Providers (APPs -  Physician Assistants and Nurse Practitioners) who all work together to provide you with the care you need, when you need it.  Your next  appointment:   1 month(s)  The format for your next appointment:   In Person  Provider:   Berniece Salines, DO  Other Instructions      Adopting a Healthy Lifestyle.  Know what a healthy weight is for you (roughly BMI <25) and aim to maintain this   Aim for 7+ servings of fruits and vegetables daily   65-80+ fluid ounces of water or unsweet tea for healthy kidneys   Limit to max 1 drink of alcohol per day; avoid smoking/tobacco   Limit animal fats in diet for cholesterol and heart health - choose grass fed whenever available   Avoid highly processed foods, and foods high in saturated/trans fats   Aim for low stress - take time to unwind and care for your mental health   Aim for 150 min of moderate intensity exercise weekly for heart health, and weights twice weekly for bone health   Aim for 7-9 hours of sleep daily   When it comes to diets, agreement about the perfect plan isnt easy to find, even among the experts. Experts at the [employer] developed an idea known as the Healthy Eating Plate. Just imagine a plate divided into logical, healthy portions.   The emphasis is on diet quality:   Load up on vegetables and fruits - one-half of your plate: Aim for color and variety, and remember that potatoes dont count.   Go for whole grains - one-quarter of your plate: Whole wheat, barley, wheat berries, quinoa, oats, brown rice, and foods made with them. If you want pasta, go with whole wheat pasta.   Protein power - one-quarter of your plate: Fish, chicken, beans, and nuts are all healthy, versatile protein sources. Limit red meat.   The diet, however, does go beyond the plate, offering a few other suggestions.   Use healthy plant oils, such as olive, canola, soy, corn, sunflower and peanut. Check the labels, and avoid partially hydrogenated oil, which have unhealthy trans fats.   If youre thirsty, drink water. Coffee and tea are good in moderation, but  skip sugary drinks and limit milk and dairy products to one or two daily servings.   The type of carbohydrate in the diet is more important than the amount. Some sources of carbohydrates, such as vegetables, fruits, whole grains, and beans-are healthier than others.   Finally, stay active  Signed, Berniece Salines, DO  01/31/2020 3:32 PM    [facility]

## 2020-01-31 NOTE — Patient Instructions (Signed)
Medication Instructions:  Your physician recommends that you continue on your current medications as directed. Please refer to the Current Medication list given to you today.  *If you need a refill on your cardiac medications before your next appointment, please call your pharmacy*  Lab Work: None If you have labs (blood work) drawn today and your tests are completely normal, you will receive your results only by: Marland Kitchen MyChart Message (if you have MyChart) OR . A paper copy in the mail If you have any lab test that is abnormal or we need to change your treatment, we will call you to review the results.  Testing/Procedures: None  Follow-Up: At [facility], you and your health needs are our priority.  As part of our continuing mission to provide you with exceptional heart care, we have created designated Provider Care Teams.  These Care Teams include your primary Cardiologist (physician) and Advanced Practice Providers (APPs -  Physician Assistants and Nurse Practitioners) who all work together to provide you with the care you need, when you need it.  Your next appointment:   1 month(s)  The format for your next appointment:   In Person  Provider:   Berniece Salines, DO  Other Instructions

## 2020-02-10 ENCOUNTER — Telehealth: Payer: Self-pay | Admitting: Cardiology

## 2020-02-10 MED ORDER — HYDROCHLOROTHIAZIDE 25 MG PO TABS: 25.0000 mg | ORAL_TABLET | Freq: Every day | ORAL | 3 refills | Status: DC

## 2020-02-10 MED ORDER — AMLODIPINE BESYLATE 5 MG PO TABS: 5.0000 mg | ORAL_TABLET | Freq: Every day | ORAL | 3 refills | Status: DC

## 2020-02-10 NOTE — Telephone Encounter (Signed)
Have her cut back the amlodipine to 5 mg daily.  And increase hydrochlorothiazide to 25 mg

## 2020-02-10 NOTE — Telephone Encounter (Signed)
New Message  Pt c/o swelling: STAT is pt has developed SOB within 24 hours  1) How much weight have you gained and in what time span? N/A  2) If swelling, where is the swelling located? Ankles and feet  3) Are you currently taking a fluid pill? hydrochlorothiazide (HYDRODIURIL) 25 MG tablet  4) Are you currently SOB? No  5) Do you have a log of your daily weights (if so, list)? No   6) Have you gained 3 pounds in a day or 5 pounds in a week? N/A  7) Have you traveled recently? Yes; Went to [employer] and drug store

## 2020-02-10 NOTE — Telephone Encounter (Signed)
The patient called to inform us that her ankles and feet are still swollen from last night.  I advised her that we recently made a medication change 02/10/20, increased HCTZ to 25 mg Daily and decreased her Amlodipine to 5 mg   .  I told her to elevate legs and give the medication change a few days to work and call with update.  She verbalized understanding.

## 2020-02-10 NOTE — Telephone Encounter (Signed)
Pt verbalized understanding of the med changes recommended by Dr.Tobb.

## 2020-02-10 NOTE — Telephone Encounter (Signed)
Pt called to report that she has noticed that when she was out Sunday shopping she had swollen ankle and feet after she got home... they were red and aching. They were relieved with elevation. She then noticed it again last night after being out with friends.   She denies SOB, chest pain... no dizziness.   Pt has not had a change in her diet. She had a change in her meds with Dr. Harriet Masson 01/31/20... amlodipine was increased to 10 mg qd and HCTZ 12.5 mg qd.   Her BP was 132/74.   Will forward to Dr. Harriet Masson for review.

## 2020-02-10 NOTE — Telephone Encounter (Signed)
Pt c/o swelling: STAT is pt has developed SOB within 24 hours  1) How much weight have you gained and in what time span? unsure  2) If swelling, where is the swelling located? ankles  3) Are you currently taking a fluid pill? no  4) Are you currently SOB? no  5) Do you have a log of your daily weights (if so, list)? No, does not have a scale  6) Have you gained 3 pounds in a day or 5 pounds in a week?  unsure  7) Have you traveled recently? no  Patient states that on Sunday she went out walking and it felt like her feet were on fire. They were swollen and red when she got home. She went out with her friends last night and when she got home, her ankles were swollen. Denies any symptoms.

## 2020-02-15 ENCOUNTER — Telehealth: Payer: Self-pay | Admitting: Cardiology

## 2020-02-15 NOTE — Telephone Encounter (Signed)
I spoke to the patient who called to inform us that her swelling in the ankles/feet has decreased since the medication change last Thursday.  There is no swelling as of today.    She does want [place] to know that as of this morning, her left leg is "tingling and is heavier" than the right leg.  It is similar to when your leg falls asleep.  It is not sore, no warmth or redness.  I told her that I would seek advisement.

## 2020-02-15 NOTE — Telephone Encounter (Signed)
I would recommend the patient see her PCP.

## 2020-02-15 NOTE — Telephone Encounter (Signed)
I spoke to the patient with Dr Terrial Rhodes advisement to reach out to her PCP.  She verbalized understanding.

## 2020-02-15 NOTE — Telephone Encounter (Signed)
   Pt wants to speak with nurse regarding her legs. Per phone note 02/10/20 pt said it's related to her feet being swollen  and her left leg started to feel numb(feel like leg is asleep). It started this morning.   Please call

## 2020-02-16 ENCOUNTER — Telehealth: Payer: Self-pay | Admitting: *Deleted

## 2020-02-16 DIAGNOSIS — Z885 Allergy status to narcotic agent status: Secondary | ICD-10-CM | POA: Diagnosis not present

## 2020-02-16 DIAGNOSIS — E785 Hyperlipidemia, unspecified: Secondary | ICD-10-CM | POA: Diagnosis not present

## 2020-02-16 DIAGNOSIS — I361 Nonrheumatic tricuspid (valve) insufficiency: Secondary | ICD-10-CM | POA: Diagnosis not present

## 2020-02-16 DIAGNOSIS — R29898 Other symptoms and signs involving the musculoskeletal system: Secondary | ICD-10-CM | POA: Diagnosis not present

## 2020-02-16 DIAGNOSIS — G459 Transient cerebral ischemic attack, unspecified: Secondary | ICD-10-CM | POA: Diagnosis not present

## 2020-02-16 DIAGNOSIS — Z79899 Other long term (current) drug therapy: Secondary | ICD-10-CM | POA: Diagnosis not present

## 2020-02-16 DIAGNOSIS — M1990 Unspecified osteoarthritis, unspecified site: Secondary | ICD-10-CM | POA: Diagnosis not present

## 2020-02-16 DIAGNOSIS — E039 Hypothyroidism, unspecified: Secondary | ICD-10-CM | POA: Diagnosis not present

## 2020-02-16 DIAGNOSIS — Z7982 Long term (current) use of aspirin: Secondary | ICD-10-CM | POA: Diagnosis not present

## 2020-02-16 DIAGNOSIS — Z882 Allergy status to sulfonamides status: Secondary | ICD-10-CM | POA: Diagnosis not present

## 2020-02-16 DIAGNOSIS — R200 Anesthesia of skin: Secondary | ICD-10-CM | POA: Diagnosis not present

## 2020-02-16 DIAGNOSIS — M79602 Pain in left arm: Secondary | ICD-10-CM | POA: Diagnosis not present

## 2020-02-16 DIAGNOSIS — I10 Essential (primary) hypertension: Secondary | ICD-10-CM | POA: Diagnosis not present

## 2020-02-16 DIAGNOSIS — R202 Paresthesia of skin: Secondary | ICD-10-CM | POA: Diagnosis not present

## 2020-02-16 DIAGNOSIS — I252 Old myocardial infarction: Secondary | ICD-10-CM | POA: Diagnosis not present

## 2020-02-16 NOTE — Telephone Encounter (Signed)
Left message on voicemail in reference to upcoming appointment scheduled for 02/24/2020. Phone number given for a call back so details instructions can be given. Sharon Miranda, [name] No mychart available

## 2020-02-17 ENCOUNTER — Telehealth (HOSPITAL_COMMUNITY): Payer: Self-pay | Admitting: Radiology

## 2020-02-17 DIAGNOSIS — R200 Anesthesia of skin: Secondary | ICD-10-CM | POA: Diagnosis not present

## 2020-02-17 DIAGNOSIS — E876 Hypokalemia: Secondary | ICD-10-CM | POA: Diagnosis not present

## 2020-02-17 DIAGNOSIS — I6523 Occlusion and stenosis of bilateral carotid arteries: Secondary | ICD-10-CM | POA: Diagnosis not present

## 2020-02-17 DIAGNOSIS — E039 Hypothyroidism, unspecified: Secondary | ICD-10-CM | POA: Diagnosis not present

## 2020-02-17 DIAGNOSIS — I361 Nonrheumatic tricuspid (valve) insufficiency: Secondary | ICD-10-CM | POA: Diagnosis not present

## 2020-02-17 DIAGNOSIS — I10 Essential (primary) hypertension: Secondary | ICD-10-CM | POA: Diagnosis not present

## 2020-02-17 NOTE — Telephone Encounter (Signed)
Patient given detailed instructions per Myocardial Perfusion Study Information Sheet for the test on 02/24/2020 at 8am. Patient notified to arrive 15 minutes early and that it is imperative to arrive on time for appointment to keep from having the test rescheduled.  If you need to cancel or reschedule your appointment, please call the office within 24 hours of your appointment. . Patient verbalized understanding.Ehk

## 2020-02-23 DIAGNOSIS — I280 Arteriovenous fistula of pulmonary vessels: Secondary | ICD-10-CM | POA: Diagnosis not present

## 2020-02-23 DIAGNOSIS — R202 Paresthesia of skin: Secondary | ICD-10-CM | POA: Diagnosis not present

## 2020-02-23 DIAGNOSIS — Z6823 Body mass index (BMI) 23.0-23.9, adult: Secondary | ICD-10-CM | POA: Diagnosis not present

## 2020-02-23 DIAGNOSIS — Z9181 History of falling: Secondary | ICD-10-CM | POA: Diagnosis not present

## 2020-02-24 ENCOUNTER — Ambulatory Visit (INDEPENDENT_AMBULATORY_CARE_PROVIDER_SITE_OTHER): Payer: Medicare Other

## 2020-02-24 ENCOUNTER — Ambulatory Visit: Payer: Medicare Other | Admitting: Cardiology

## 2020-02-24 ENCOUNTER — Other Ambulatory Visit: Payer: Self-pay

## 2020-02-24 VITALS — Ht 62.0 in | Wt 133.0 lb

## 2020-02-24 DIAGNOSIS — R072 Precordial pain: Secondary | ICD-10-CM | POA: Diagnosis not present

## 2020-02-24 LAB — MYOCARDIAL PERFUSION IMAGING
LV dias vol: 41 mL (ref 46–106)
LV sys vol: 7 mL
Peak HR: 114 {beats}/min
Rest HR: 90 {beats}/min
SDS: 1
SRS: 0
SSS: 1
TID: 0.95

## 2020-02-24 MED ORDER — TECHNETIUM TC 99M TETROFOSMIN IV KIT
10.3000 | PACK | Freq: Once | INTRAVENOUS | Status: AC | PRN
  Administered 2020-02-24: 10.3 via INTRAVENOUS

## 2020-02-24 MED ORDER — REGADENOSON 0.4 MG/5ML IV SOLN
0.4000 mg | Freq: Once | INTRAVENOUS | Status: AC
  Administered 2020-02-24: 0.4 mg via INTRAVENOUS

## 2020-02-24 MED ORDER — TECHNETIUM TC 99M TETROFOSMIN IV KIT
32.8000 | PACK | Freq: Once | INTRAVENOUS | Status: AC | PRN
  Administered 2020-02-24: 32.8 via INTRAVENOUS

## 2020-02-24 NOTE — Progress Notes (Unsigned)
Cardiology Office Note:    Date:  02/24/2020   ID:  Sharon Miranda, DOB [birth year], MRN [id number]  PCP:  Mateo Flow, MD  Cardiologist:  Berniece Salines, DO  Electrophysiologist:  None   Referring MD: Mateo Flow, MD   No chief complaint on file. ***  History of Present Illness:    Sharon Miranda is a 69 y.o. female with a hx of ***  Past Medical History:  Diagnosis Date  . Adult hypothyroidism   . Arthralgia of hand   . Benign essential HTN   . Mixed hyperlipidemia 01/26/2020  . Murmur 01/26/2020  . Precordial pain 01/26/2020  . Recurrent genital herpes     Past Surgical History:  Procedure Laterality Date  . ABDOMINAL HYSTERECTOMY     due to endometriosis  . APPENDECTOMY  1996  . CHOLECYSTECTOMY  2008  . TONSILLECTOMY AND ADENOIDECTOMY      Current Medications: No outpatient medications have been marked as taking for the 02/24/20 encounter (Appointment) with Berniece Salines, DO.     Allergies:   Codeine sulfate [codeine]   Social History   Socioeconomic History  . Marital status: Single    Spouse name: Not on file  . Number of children: Not on file  . Years of education: Not on file  . Highest education level: Not on file  Occupational History  . Not on file  Tobacco Use  . Smoking status: Never Smoker  . Smokeless tobacco: Never Used  Substance and Sexual Activity  . Alcohol use: Not Currently  . Drug use: Never  . Sexual activity: Not on file  Other Topics Concern  . Not on file  Social History Narrative  . Not on file   Social Determinants of Health   Financial Resource Strain:   . Difficulty of Paying Living Expenses:   Food Insecurity:   . Worried About Charity fundraiser in the Last Year:   . Arboriculturist in the Last Year:   Transportation Needs:   . Film/video editor (Medical):   Marland Kitchen Lack of Transportation (Non-Medical):   Physical Activity:   . Days of Exercise per Week:   . Minutes of Exercise per Session:   Stress:   .  Feeling of Stress :   Social Connections:   . Frequency of Communication with Friends and Family:   . Frequency of Social Gatherings with Friends and Family:   . Attends Religious Services:   . Active Member of Clubs or Organizations:   . Attends Archivist Meetings:   Marland Kitchen Marital Status:      Family History: The patient's family history includes Addison's disease in her brother; COPD in her brother; Heart attack in her brother, brother, and mother; Leukemia in her brother.  ROS:   Review of Systems  Constitution: Negative for decreased appetite, fever and weight gain.  HENT: Negative for congestion, ear discharge, hoarse voice and sore throat.   Eyes: Negative for discharge, redness, vision loss in right eye and visual halos.  Cardiovascular: Negative for chest pain, dyspnea on exertion, leg swelling, orthopnea and palpitations.  Respiratory: Negative for cough, hemoptysis, shortness of breath and snoring.   Endocrine: Negative for heat intolerance and polyphagia.  Hematologic/Lymphatic: Negative for bleeding problem. Does not bruise/bleed easily.  Skin: Negative for flushing, nail changes, rash and suspicious lesions.  Musculoskeletal: Negative for arthritis, joint pain, muscle cramps, myalgias, neck pain and stiffness.  Gastrointestinal: Negative for abdominal pain, bowel incontinence, diarrhea and  excessive appetite.  Genitourinary: Negative for decreased libido, genital sores and incomplete emptying.  Neurological: Negative for brief paralysis, focal weakness, headaches and loss of balance.  Psychiatric/Behavioral: Negative for altered mental status, depression and suicidal ideas.  Allergic/Immunologic: Negative for HIV exposure and persistent infections.    EKGs/Labs/Other Studies Reviewed:    The following studies were reviewed today:   EKG:  The ekg ordered today demonstrates   Recent Labs: No results found for requested labs within last 8760 hours.  Recent  Lipid Panel No results found for: CHOL, TRIG, HDL, CHOLHDL, VLDL, LDLCALC, LDLDIRECT  Physical Exam:    VS:  There were no vitals taken for this visit.    Wt Readings from Last 3 Encounters:  02/24/20 133 lb (60.3 kg)  01/31/20 133 lb (60.3 kg)  01/26/20 134 lb (60.8 kg)     GEN: Well nourished, well developed in no acute distress HEENT: Normal NECK: No JVD; No carotid bruits LYMPHATICS: No lymphadenopathy CARDIAC: S1S2 noted,RRR, no murmurs, rubs, gallops RESPIRATORY:  Clear to auscultation without rales, wheezing or rhonchi  ABDOMEN: Soft, non-tender, non-distended, +bowel sounds, no guarding. EXTREMITIES: No edema, No cyanosis, no clubbing MUSCULOSKELETAL:  No deformity  SKIN: Warm and dry NEUROLOGIC:  Alert and oriented x 3, non-focal PSYCHIATRIC:  Normal affect, good insight  ASSESSMENT:    No diagnosis found. PLAN:     1.  The patient is in agreement with the above plan. The patient left the office in stable condition.  The patient will follow up in   Medication Adjustments/Labs and Tests Ordered: Current medicines are reviewed at length with the patient today.  Concerns regarding medicines are outlined above.  No orders of the defined types were placed in this encounter.  No orders of the defined types were placed in this encounter.   There are no Patient Instructions on file for this visit.   Adopting a Healthy Lifestyle.  Know what a healthy weight is for you (roughly BMI <25) and aim to maintain this   Aim for 7+ servings of fruits and vegetables daily   65-80+ fluid ounces of water or unsweet tea for healthy kidneys   Limit to max 1 drink of alcohol per day; avoid smoking/tobacco   Limit animal fats in diet for cholesterol and heart health - choose grass fed whenever available   Avoid highly processed foods, and foods high in saturated/trans fats   Aim for low stress - take time to unwind and care for your mental health   Aim for 150 min of  moderate intensity exercise weekly for heart health, and weights twice weekly for bone health   Aim for 7-9 hours of sleep daily   When it comes to diets, agreement about the perfect plan isnt easy to find, even among the experts. Experts at the [employer] developed an idea known as the Healthy Eating Plate. Just imagine a plate divided into logical, healthy portions.   The emphasis is on diet quality:   Load up on vegetables and fruits - one-half of your plate: Aim for color and variety, and remember that potatoes dont count.   Go for whole grains - one-quarter of your plate: Whole wheat, barley, wheat berries, quinoa, oats, brown rice, and foods made with them. If you want pasta, go with whole wheat pasta.   Protein power - one-quarter of your plate: Fish, chicken, beans, and nuts are all healthy, versatile protein sources. Limit red meat.   The diet, however,  does go beyond the plate, offering a few other suggestions.   Use healthy plant oils, such as olive, canola, soy, corn, sunflower and peanut. Check the labels, and avoid partially hydrogenated oil, which have unhealthy trans fats.   If youre thirsty, drink water. Coffee and tea are good in moderation, but skip sugary drinks and limit milk and dairy products to one or two daily servings.   The type of carbohydrate in the diet is more important than the amount. Some sources of carbohydrates, such as vegetables, fruits, whole grains, and beans-are healthier than others.   Finally, stay active  Signed, Berniece Salines, DO  02/24/2020 8:13 AM    [place]

## 2020-02-28 ENCOUNTER — Telehealth: Payer: Self-pay | Admitting: Cardiology

## 2020-02-28 ENCOUNTER — Other Ambulatory Visit: Payer: Self-pay | Admitting: Cardiology

## 2020-02-28 NOTE — Telephone Encounter (Signed)
Reviewed the results of the patients recent stress test with her. The patient verbalized understanding.  All questions (if any) were answered. Wilma Flavin, RN 02/28/2020 3:19 PM

## 2020-02-28 NOTE — Telephone Encounter (Signed)
Patient would like someone to go over the results of her test from 03-18 once again. She does not remember anyone from the office calling her on Friday

## 2020-02-28 NOTE — Telephone Encounter (Signed)
*  STAT* If patient is at the pharmacy, call can be transferred to refill team.   1. Which medications need to be refilled? (please list name of each medication and dose if known)  hydrochlorothiazide (HYDRODIURIL) 25 MG tablet amLODipine (NORVASC) 5 MG tablet  2. Which pharmacy/location (including street and city if local pharmacy) is medication to be sent to? [facility] [place]  3. Do they need a 30 day or 90 day supply? 90 with refills

## 2020-02-29 MED ORDER — AMLODIPINE BESYLATE 5 MG PO TABS: 5.0000 mg | ORAL_TABLET | Freq: Every day | ORAL | 2 refills | Status: DC

## 2020-02-29 MED ORDER — HYDROCHLOROTHIAZIDE 25 MG PO TABS: 25.0000 mg | ORAL_TABLET | Freq: Every day | ORAL | 2 refills | Status: DC

## 2020-03-15 ENCOUNTER — Ambulatory Visit (INDEPENDENT_AMBULATORY_CARE_PROVIDER_SITE_OTHER): Payer: Medicare Other

## 2020-03-15 ENCOUNTER — Other Ambulatory Visit: Payer: Self-pay

## 2020-03-15 DIAGNOSIS — I10 Essential (primary) hypertension: Secondary | ICD-10-CM | POA: Diagnosis not present

## 2020-03-15 DIAGNOSIS — R011 Cardiac murmur, unspecified: Secondary | ICD-10-CM | POA: Diagnosis not present

## 2020-03-15 NOTE — Progress Notes (Signed)
Complete echocardiogram performed.  Jimmy Castella Lerner RDCS, RVT  

## 2020-03-16 ENCOUNTER — Telehealth: Payer: Self-pay

## 2020-03-16 NOTE — Telephone Encounter (Signed)
The patient has been notified of the Echo result and verbalized understanding.  All questions (if any) were answered. Frederik Schmidt, RN 03/16/2020 2:16 PM

## 2020-03-16 NOTE — Telephone Encounter (Signed)
lpmtcb 4/8

## 2020-03-16 NOTE — Telephone Encounter (Signed)
-----   Message from Berniece Salines, DO sent at 03/16/2020 12:35 AM EDT ----- The echo showed that the heart is not fully relaxing like it should ( diastolic dysfunction) ,but otherwise normal. I will discuss it at the next office visit.

## 2020-03-20 DIAGNOSIS — Z1231 Encounter for screening mammogram for malignant neoplasm of breast: Secondary | ICD-10-CM | POA: Diagnosis not present

## 2020-03-30 ENCOUNTER — Encounter: Payer: Self-pay | Admitting: Neurology

## 2020-03-30 ENCOUNTER — Ambulatory Visit: Payer: Medicare Other | Admitting: Neurology

## 2020-03-30 ENCOUNTER — Telehealth: Payer: Self-pay | Admitting: Neurology

## 2020-03-30 ENCOUNTER — Other Ambulatory Visit: Payer: Self-pay

## 2020-03-30 VITALS — BP 132/76 | HR 82 | Temp 96.3°F | Ht 61.0 in | Wt 129.6 lb

## 2020-03-30 DIAGNOSIS — Z79899 Other long term (current) drug therapy: Secondary | ICD-10-CM | POA: Diagnosis not present

## 2020-03-30 DIAGNOSIS — Q211 Atrial septal defect: Secondary | ICD-10-CM

## 2020-03-30 DIAGNOSIS — G459 Transient cerebral ischemic attack, unspecified: Secondary | ICD-10-CM

## 2020-03-30 DIAGNOSIS — R202 Paresthesia of skin: Secondary | ICD-10-CM

## 2020-03-30 DIAGNOSIS — R200 Anesthesia of skin: Secondary | ICD-10-CM

## 2020-03-30 DIAGNOSIS — Q2112 Patent foramen ovale: Secondary | ICD-10-CM

## 2020-03-30 MED ORDER — ATORVASTATIN CALCIUM 20 MG PO TABS: 20.0000 mg | ORAL_TABLET | Freq: Every day | ORAL | 11 refills | Status: DC

## 2020-03-30 NOTE — Patient Instructions (Signed)
I had a long d/w patient and her friend about her recent TIA, risk for recurrent stroke/TIAs, personally independently reviewed imaging studies and stroke evaluation results and answered questions.Continue aspirin 81 mg daily  for secondary stroke prevention and maintain strict control of hypertension with blood pressure goal below 130/90, diabetes with hemoglobin A1c goal below 6.5% and lipids with LDL cholesterol goal below 70 mg/dL. I also advised the patient to eat a healthy diet with plenty of whole grains, cereals, fruits and vegetables, exercise regularly and maintain ideal body weight .  Check CT angiogram of the brain and neck to evaluate posterior circulation as well as hemoglobin A1c.  She was given a refill for Lipitor which she ran out of.  Check EMG nerve conduction study to look for any underlying radiculopathy..Followup in the future with me in 3 months or call earlier if necessary.  Stroke Prevention Some medical conditions and behaviors are associated with a higher chance of having a stroke. You can help prevent a stroke by making nutrition, lifestyle, and other changes, including managing any medical conditions you may have. What nutrition changes can be made?   Eat healthy foods. You can do this by: ? Choosing foods high in fiber, such as fresh fruits and vegetables and whole grains. ? Eating at least 5 or more servings of fruits and vegetables a day. Try to fill half of your plate at each meal with fruits and vegetables. ? Choosing lean protein foods, such as lean cuts of meat, poultry without skin, fish, tofu, beans, and nuts. ? Eating low-fat dairy products. ? Avoiding foods that are high in salt (sodium). This can help lower blood pressure. ? Avoiding foods that have saturated fat, trans fat, and cholesterol. This can help prevent high cholesterol. ? Avoiding processed and premade foods.  Follow your health care provider's specific guidelines for losing weight, controlling  high blood pressure (hypertension), lowering high cholesterol, and managing diabetes. These may include: ? Reducing your daily calorie intake. ? Limiting your daily sodium intake to 1,500 milligrams (mg). ? Using only healthy fats for cooking, such as olive oil, canola oil, or sunflower oil. ? Counting your daily carbohydrate intake. What lifestyle changes can be made?  Maintain a healthy weight. Talk to your health care provider about your ideal weight.  Get at least 30 minutes of moderate physical activity at least 5 days a week. Moderate activity includes brisk walking, biking, and swimming.  Do not use any products that contain nicotine or tobacco, such as cigarettes and e-cigarettes. If you need help quitting, ask your health care provider. It may also be helpful to avoid exposure to secondhand smoke.  Limit alcohol intake to no more than 1 drink a day for nonpregnant women and 2 drinks a day for men. One drink equals 12 oz of beer, 5 oz of wine, or 1 oz of hard liquor.  Stop any illegal drug use.  Avoid taking birth control pills. Talk to your health care provider about the risks of taking birth control pills if: ? You are over 75 years old. ? You smoke. ? You get migraines. ? You have ever had a blood clot. What other changes can be made?  Manage your cholesterol levels. ? Eating a healthy diet is important for preventing high cholesterol. If cholesterol cannot be managed through diet alone, you may also need to take medicines. ? Take any prescribed medicines to control your cholesterol as told by your health care provider.  Manage your diabetes. ?  Eating a healthy diet and exercising regularly are important parts of managing your blood sugar. If your blood sugar cannot be managed through diet and exercise, you may need to take medicines. ? Take any prescribed medicines to control your diabetes as told by your health care provider.  Control your hypertension. ? To reduce your  risk of stroke, try to keep your blood pressure below 130/80. ? Eating a healthy diet and exercising regularly are an important part of controlling your blood pressure. If your blood pressure cannot be managed through diet and exercise, you may need to take medicines. ? Take any prescribed medicines to control hypertension as told by your health care provider. ? Ask your health care provider if you should monitor your blood pressure at home. ? Have your blood pressure checked every year, even if your blood pressure is normal. Blood pressure increases with age and some medical conditions.  Get evaluated for sleep disorders (sleep apnea). Talk to your health care provider about getting a sleep evaluation if you snore a lot or have excessive sleepiness.  Take over-the-counter and prescription medicines only as told by your health care provider. Aspirin or blood thinners (antiplatelets or anticoagulants) may be recommended to reduce your risk of forming blood clots that can lead to stroke.  Make sure that any other medical conditions you have, such as atrial fibrillation or atherosclerosis, are managed. What are the warning signs of a stroke? The warning signs of a stroke can be easily remembered as BEFAST.  B is for balance. Signs include: ? Dizziness. ? Loss of balance or coordination. ? Sudden trouble walking.  E is for eyes. Signs include: ? A sudden change in vision. ? Trouble seeing.  F is for face. Signs include: ? Sudden weakness or numbness of the face. ? The face or eyelid drooping to one side.  A is for arms. Signs include: ? Sudden weakness or numbness of the arm, usually on one side of the body.  S is for speech. Signs include: ? Trouble speaking (aphasia). ? Trouble understanding.  T is for time. ? These symptoms may represent a serious problem that is an emergency. Do not wait to see if the symptoms will go away. Get medical help right away. Call your local emergency  services (911 in the [place] Do not drive yourself to the hospital.  Other signs of stroke may include: ? A sudden, severe headache with no known cause. ? Nausea or vomiting. ? Seizure. Where to find more information For more information, visit:  American Stroke Association: www.strokeassociation.org  National Stroke Association: www.stroke.org Summary  You can prevent a stroke by eating healthy, exercising, not smoking, limiting alcohol intake, and managing any medical conditions you may have.  Do not use any products that contain nicotine or tobacco, such as cigarettes and e-cigarettes. If you need help quitting, ask your health care provider. It may also be helpful to avoid exposure to secondhand smoke.  Remember BEFAST for warning signs of stroke. Get help right away if you or a loved one has any of these signs. This information is not intended to replace advice given to you by your health care provider. Make sure you discuss any questions you have with your health care provider. Document Revised: 11/07/2017 Document Reviewed: 12/31/2016 Elsevier Patient Education  2020 [employer].

## 2020-03-30 NOTE — Telephone Encounter (Signed)
[employer] medicare order sent to [facility]. No auth they will reach out to the patient to schedule.  

## 2020-03-30 NOTE — Progress Notes (Signed)
[facility] [address] [place] [address] [phone]       OFFICE CONSULT NOTE  [name] Date of Birth:  [birth year] Medical Record Number:  [id number]   Referring MD: Terrilee Croak Reason for Referral: TIA  HPI: Sharon Miranda is a pleasant 69 year old Caucasian lady with past medical history of hypertension, hyperlipidemia hypothyroidism who is seen for initial office consultation visit for TIA.  History is obtained from the patient and her friend Sharon Miranda was accompanying her as well as review of referral notes and electronic medical records and I personally reviewed imaging films in PACS.  Patient states she woke up on 02/16/2020 and felt tingling in her left leg from the hip down.  She did not have weakness.  She denies any back pain or radicular pain.  She is able to walk all right.  She denied accompanying headache, slurred speech, facial extremity numbness weakness.  The symptoms persisted so she went to [facility] where she was hospitalized.  She was seen by telemetry neurologist.  MRI scan of the brain was obtained which showed no acute stroke only changes of small vessel disease and age-appropriate atrophy.  Carotid ultrasound showed no significant extracranial stenosis.  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  Lower extremity venous Dopplers were not done.  LDL cholesterol was elevated at 105 mg percent.  Hemoglobin A1c was not checked.  She was discharged on aspirin Plavix and states she is done well.  She stopped Plavix after 3 weeks and is currently on aspirin alone tolerating well without bruising or bleeding.  Blood pressure well controlled today it is 132/76.  She was also discharged on Lipitor which she took for a month but after prescription ran out she stopped taking it.  She has had no further episodes of stroke or strokelike events however she states that after she got her Covid shot in  January she felt that left leg felt like a rubber for a few hours and that recovered.  She did not seek medical help at that time.  She denies any significant back pain, radicular pain, tingling or numbness in the feet or gait or balance problems.  She has no other prior history of strokes TIAs seizures or significant neurological problems.  ROS:   14 system review of systems is positive for numbness and decreased hearing only all other systems negative PMH:  Past Medical History:  Diagnosis Date  . Adult hypothyroidism   . Arthralgia of hand   . Benign essential HTN   . Mixed hyperlipidemia 01/26/2020  . Murmur 01/26/2020  . Precordial pain 01/26/2020  . Recurrent genital herpes     Social History:  Social History   Socioeconomic History  . Marital status: Single    Spouse name: Not on file  . Number of children: Not on file  . Years of education: Not on file  . Highest education level: Not on file  Occupational History  . Not on file  Tobacco Use  . Smoking status: Never Smoker  . Smokeless tobacco: Never Used  Substance and Sexual Activity  . Alcohol use: Yes    Alcohol/week: 1.0 standard drinks    Types: 1 Cans of beer per week  . Drug use: Never  . Sexual activity: Not on file  Other Topics Concern  . Not on file  Social History Narrative  . Not on file   Social Determinants of Health  Financial Resource Strain:   . Difficulty of Paying Living Expenses:   Food Insecurity:   . Worried About Charity fundraiser in the Last Year:   . Arboriculturist in the Last Year:   Transportation Needs:   . Film/video editor (Medical):   Marland Kitchen Lack of Transportation (Non-Medical):   Physical Activity:   . Days of Exercise per Week:   . Minutes of Exercise per Session:   Stress:   . Feeling of Stress :   Social Connections:   . Frequency of Communication with Friends and Family:   . Frequency of Social Gatherings with Friends and Family:   . Attends Religious Services:     . Active Member of Clubs or Organizations:   . Attends Archivist Meetings:   Marland Kitchen Marital Status:   Intimate Partner Violence:   . Fear of Current or Ex-Partner:   . Emotionally Abused:   Marland Kitchen Physically Abused:   . Sexually Abused:     Medications:   Current Outpatient Medications on File Prior to Visit  Medication Sig Dispense Refill  . amLODipine (NORVASC) 5 MG tablet Take 1 tablet (5 mg total) by mouth daily. 90 tablet 2  . aspirin EC 81 MG tablet Take 81 mg by mouth daily.    . hydrochlorothiazide (HYDRODIURIL) 25 MG tablet Take 1 tablet (25 mg total) by mouth daily. 90 tablet 2  . levothyroxine (SYNTHROID) 137 MCG tablet Take 137 mcg by mouth daily.    . nitroGLYCERIN (NITROSTAT) 0.4 MG SL tablet Place 1 tablet (0.4 mg total) under the tongue every 5 (five) minutes as needed. 30 tablet 3   No current facility-administered medications on file prior to visit.    Allergies:   Allergies  Allergen Reactions  . Codeine Sulfate [Codeine] Itching    Physical Exam General: well developed, well nourished, seated, in no evident distress Head: head normocephalic and atraumatic.   Neck: supple with no carotid or supraclavicular bruits Cardiovascular: regular rate and rhythm, no murmurs Musculoskeletal: no deformity Skin:  no rash/petichiae Vascular:  Normal pulses all extremities  Neurologic Exam Mental Status: Awake and fully alert. Oriented to place and time. Recent and remote memory intact. Attention span, concentration and fund of knowledge appropriate. Mood and affect appropriate.  Cranial Nerves: Fundoscopic exam reveals sharp disc margins. Pupils equal, briskly reactive to light. Extraocular movements full without nystagmus. Visual fields full to confrontation. Hearing intact. Facial sensation intact. Face, tongue, palate moves normally and symmetrically.  Motor: Normal bulk and tone. Normal strength in all tested extremity muscles. Sensory.: intact to touch , pinprick  , position and vibratory sensation.  Coordination: Rapid alternating movements normal in all extremities. Finger-to-nose and heel-to-shin performed accurately bilaterally. Gait and Station: Arises from chair without difficulty. Stance is normal. Gait demonstrates normal stride length and balance . Able to heel, toe and tandem walk without difficulty.  Reflexes: 1+ and symmetric. Toes downgoing.   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  Vascular risk factors of hyperlipidemia, hypertension and age only     PLAN: I had a long d/w patient and her friend about her recent TIA, risk for recurrent stroke/TIAs, personally independently reviewed imaging studies and stroke evaluation results and answered questions.Continue aspirin 81 mg daily  for secondary stroke prevention and maintain strict control of hypertension with blood pressure goal below 130/90,  diabetes with hemoglobin A1c goal below 6.5% and lipids with LDL cholesterol goal below 70 mg/dL. I also advised the patient to eat a healthy diet with plenty of whole grains, cereals, fruits and vegetables, exercise regularly and maintain ideal body weight .  Check CT angiogram of the brain and neck to evaluate posterior circulation as well as hemoglobin A1c.  She was given a refill for Lipitor which she ran out of.  Check EMG nerve conduction study to look for any underlying radiculopathy.. Greater than 50% time during this 50-minute consultation visit was spent on counseling and coordination of care about her left leg numbness and discussion about TIA and radiculopathy answering questions followup in the future with me in 3 months or call earlier if necessary. Antony Contras, MD  [facility] [address] [place], [place] [address]  Phone [phone] Fax [phone] Note: This  document was prepared with digital dictation and possible smart phrase technology. Any transcriptional errors that result from this process are unintentional.

## 2020-03-31 LAB — HEMOGLOBIN A1C
Est. average glucose Bld gHb Est-mCnc: 108 mg/dL
Hgb A1c MFr Bld: 5.4 % (ref 4.8–5.6)

## 2020-04-13 ENCOUNTER — Encounter: Payer: Medicare Other | Admitting: Neurology

## 2020-04-14 NOTE — Progress Notes (Signed)
Kindly inform the patient that screening test for diabetes was satisfactory

## 2020-04-18 ENCOUNTER — Ambulatory Visit
Admission: RE | Admit: 2020-04-18 | Discharge: 2020-04-18 | Disposition: A | Discharge status: Home / Self Care | Payer: Medicare Other | Source: Ambulatory Visit | Attending: Neurology | Admitting: Neurology

## 2020-04-18 DIAGNOSIS — G459 Transient cerebral ischemic attack, unspecified: Secondary | ICD-10-CM

## 2020-04-18 MED ORDER — IOPAMIDOL (ISOVUE-370) INJECTION 76%
75.0000 mL | Freq: Once | INTRAVENOUS | Status: AC | PRN
  Administered 2020-04-18: 75 mL via INTRAVENOUS

## 2020-04-19 ENCOUNTER — Telehealth: Payer: Self-pay

## 2020-04-19 NOTE — Telephone Encounter (Signed)
I called pt that the test for diabetes were satisfactory pt verbalized understanding.

## 2020-04-19 NOTE — Telephone Encounter (Signed)
-----   Message from Garvin Fila, MD sent at 04/14/2020  2:06 PM EDT ----- Mitchell Heir inform the patient that screening test for diabetes was satisfactory

## 2020-04-20 ENCOUNTER — Telehealth: Payer: Self-pay | Admitting: *Deleted

## 2020-04-20 NOTE — Telephone Encounter (Signed)
LVM informing patient that her CT angiogram of the brain and neck does not show any significant large vessel narrowing in the neck of the brain. There are findings in the chest noted for a typical infection. Dr Leonie Man  advises she see her primary care physician for further evaluation of this.  Left # for questions.

## 2020-04-20 NOTE — Progress Notes (Signed)
Kindly inform the patient that CT angiogram of the brain and neck does not show any significant large vessel narrowing in the neck of the brain.  There are findings in the chest noted for a typical infection and kindly advise the patient to see primary care physician for further evaluation of this.

## 2020-04-24 ENCOUNTER — Ambulatory Visit: Payer: Medicare Other | Admitting: Neurology

## 2020-04-24 ENCOUNTER — Ambulatory Visit (INDEPENDENT_AMBULATORY_CARE_PROVIDER_SITE_OTHER): Payer: Medicare Other | Admitting: Neurology

## 2020-04-24 ENCOUNTER — Other Ambulatory Visit: Payer: Self-pay

## 2020-04-24 ENCOUNTER — Encounter: Payer: Self-pay | Admitting: Neurology

## 2020-04-24 DIAGNOSIS — R200 Anesthesia of skin: Secondary | ICD-10-CM

## 2020-04-24 DIAGNOSIS — Z0289 Encounter for other administrative examinations: Secondary | ICD-10-CM

## 2020-04-24 DIAGNOSIS — R202 Paresthesia of skin: Secondary | ICD-10-CM | POA: Diagnosis not present

## 2020-04-24 DIAGNOSIS — R29898 Other symptoms and signs involving the musculoskeletal system: Secondary | ICD-10-CM

## 2020-04-24 NOTE — Procedures (Signed)
Full Name: Sharon Miranda Gender: Female MRN #: [id number] Date of Birth: 06-13-1951    Visit Date: 04/24/2020 15:29 Age: 69 Years Examining Physician: Sarina Ill, MD  Referring Physician: Antony Contras, MD Height: 5 feet 1 inch    History: Left leg numbness and weakness, patient is currently not symptomatic  Summary: EMG/NCS was performed on the bilateral lower extremities.  The left peroneal motor nerve showed reduced amplitude (1.6 mV, normal greater than 2) and decreased conduction velocity (fib head to ankle, 39 m/s, normal greater than 44) and decreased conduction velocity (pop fossa to fib head, 38 m/s, normal greater than 44).  The right peroneal motor nerve showed reduced amplitude (1.3 mV, normal greater than 2).  The left sural sensory nerve showed decreased amplitude (3 V, normal greater than 6).  The left superficial peroneal sensory nerve showed decreased amplitude (4 V, normal greater than 6).  The right superficial peroneal sensory nerve showed decreased amplitude (4 V, normal greater than 6). All remaining nerves (as indicated in the following tables) were within normal limits.  All  muscles (as indicated in the following tables) were within normal limits.    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  No suggestion of lumbar radiculopathy.   Sarina Ill, M.D.  [facility] [place], [place] [address] Tel: [phone] Fax: [phone]  Verbal informed consent was obtained from the patient, patient was informed of potential risk of procedure, including bruising, bleeding, hematoma formation, infection, muscle weakness, muscle pain, numbness, among others.         [place]    Nerve / Sites Muscle Latency Ref. Amplitude Ref. Rel Amp Segments Distance Velocity Ref. Area    ms ms mV mV %  cm m/s m/s mVms  L Peroneal - EDB     Ankle EDB 5.4 ?6.5 1.6 ?2.0 100 Ankle - EDB 9   4.7     Fib head EDB 11.6   1.5  94.7 Fib head - Ankle 24 39 ?44 3.5     Pop fossa EDB 14.2  1.6  101 Pop fossa - Fib head 10 38 ?44 3.3         Pop fossa - Ankle      R Peroneal - EDB     Ankle EDB 5.2 ?6.5 1.3 ?2.0 100 Ankle - EDB 9   3.4     Fib head EDB 10.5  1.1  83.3 Fib head - Ankle 24 45 ?44 3.2     Pop fossa EDB 12.7  1.0  93.5 Pop fossa - Fib head 10 45 ?44 2.8         Pop fossa - Ankle      L Tibial - AH     Ankle AH 4.0 ?5.8 5.5 ?4.0 100 Ankle - AH 9   10.2     Pop fossa AH 11.6  4.2  76.9 Pop fossa - Ankle 34 45 ?41 10.6           SNC    Nerve / Sites Rec. Site Peak Lat Ref.  Amp Ref. Segments Distance    ms ms V V  cm  L Sural - Ankle (Calf)     Calf Ankle 4.1 ?4.4 3 ?6 Calf - Ankle 14  L Superficial peroneal - Ankle     Lat leg Ankle 4.4 ?4.4 4 ?6 Lat leg - Ankle 14  R Superficial peroneal - Ankle     Lat leg Ankle 4.4 ?4.4  4 ?6 Lat leg - Ankle 14           F  Wave    Nerve F Lat Ref.   ms ms  L Tibial - AH 42.4 ?56.0         EMG Summary Table    Spontaneous MUAP Recruitment  Muscle IA Fib PSW Fasc Other Amp Dur. Poly Pattern  L. Vastus medialis Normal None None None _______ Normal Normal Normal Normal  L. Gastrocnemius (Medial head) Normal None None None _______ Normal Normal Normal Normal  L. Tibialis anterior Normal None None None _______ Normal Normal Normal Normal  L. Extensor hallucis longus Normal None None None _______ Normal Normal Normal Normal  L. Abductor hallucis Normal None None None _______ Normal Normal Normal Normal  L. Biceps femoris (long head) Normal None None None _______ Normal Normal Normal Normal  L. Gluteus maximus Normal None None None _______ Normal Normal Normal Normal  L. Gluteus medius Normal None None None _______ Normal Normal Normal Normal  L Lumbar paraspinals (low) Normal None None None _______ Normal Normal Normal Normal

## 2020-04-24 NOTE — Progress Notes (Signed)
See procedure note.

## 2020-04-24 NOTE — Progress Notes (Signed)
Full Name: Sharon Miranda Gender: Female MRN #: [id number] Date of Birth: Aug 14, 1951    Visit Date: 04/24/2020 15:29 Age: 69 Years Examining Physician: Sarina Ill, MD  Referring Physician: Antony Contras, MD Height: 5 feet 1 inch    History: Left leg numbness and weakness, patient is currently not symptomatic  Summary: EMG/NCS was performed on the bilateral lower extremities.  The left peroneal motor nerve showed reduced amplitude (1.6 mV, normal greater than 2) and decreased conduction velocity (fib head to ankle, 39 m/s, normal greater than 44) and decreased conduction velocity (pop fossa to fib head, 38 m/s, normal greater than 44).  The right peroneal motor nerve showed reduced amplitude (1.3 mV, normal greater than 2).  The left sural sensory nerve showed decreased amplitude (3 V, normal greater than 6).  The left superficial peroneal sensory nerve showed decreased amplitude (4 V, normal greater than 6).  The right superficial peroneal sensory nerve showed decreased amplitude (4 V, normal greater than 6). All remaining nerves (as indicated in the following tables) were within normal limits.  All  muscles (as indicated in the following tables) were within normal limits.    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  No suggestion of lumbar radiculopathy.   Sarina Ill, M.D.  [facility] [place], [place] [address] Tel: [phone] Fax: [phone]  Verbal informed consent was obtained from the patient, patient was informed of potential risk of procedure, including bruising, bleeding, hematoma formation, infection, muscle weakness, muscle pain, numbness, among others.         [name]    Nerve / Sites Muscle Latency Ref. Amplitude Ref. Rel Amp Segments Distance Velocity Ref. Area    ms ms mV mV %  cm m/s m/s mVms  L Peroneal - EDB     Ankle EDB 5.4 ?6.5 1.6 ?2.0 100 Ankle - EDB 9   4.7     Fib head EDB 11.6   1.5  94.7 Fib head - Ankle 24 39 ?44 3.5     Pop fossa EDB 14.2  1.6  101 Pop fossa - Fib head 10 38 ?44 3.3         Pop fossa - Ankle      R Peroneal - EDB     Ankle EDB 5.2 ?6.5 1.3 ?2.0 100 Ankle - EDB 9   3.4     Fib head EDB 10.5  1.1  83.3 Fib head - Ankle 24 45 ?44 3.2     Pop fossa EDB 12.7  1.0  93.5 Pop fossa - Fib head 10 45 ?44 2.8         Pop fossa - Ankle      L Tibial - AH     Ankle AH 4.0 ?5.8 5.5 ?4.0 100 Ankle - AH 9   10.2     Pop fossa AH 11.6  4.2  76.9 Pop fossa - Ankle 34 45 ?41 10.6           SNC    Nerve / Sites Rec. Site Peak Lat Ref.  Amp Ref. Segments Distance    ms ms V V  cm  L Sural - Ankle (Calf)     Calf Ankle 4.1 ?4.4 3 ?6 Calf - Ankle 14  L Superficial peroneal - Ankle     Lat leg Ankle 4.4 ?4.4 4 ?6 Lat leg - Ankle 14  R Superficial peroneal - Ankle     Lat leg Ankle 4.4 ?4.4  4 ?6 Lat leg - Ankle 14           F  Wave    Nerve F Lat Ref.   ms ms  L Tibial - AH 42.4 ?56.0         EMG Summary Table    Spontaneous MUAP Recruitment  Muscle IA Fib PSW Fasc Other Amp Dur. Poly Pattern  L. Vastus medialis Normal None None None _______ Normal Normal Normal Normal  L. Gastrocnemius (Medial head) Normal None None None _______ Normal Normal Normal Normal  L. Tibialis anterior Normal None None None _______ Normal Normal Normal Normal  L. Extensor hallucis longus Normal None None None _______ Normal Normal Normal Normal  L. Abductor hallucis Normal None None None _______ Normal Normal Normal Normal  L. Biceps femoris (long head) Normal None None None _______ Normal Normal Normal Normal  L. Gluteus maximus Normal None None None _______ Normal Normal Normal Normal  L. Gluteus medius Normal None None None _______ Normal Normal Normal Normal  L Lumbar paraspinals (low) Normal None None None _______ Normal Normal Normal Normal

## 2020-04-25 ENCOUNTER — Other Ambulatory Visit: Payer: Self-pay

## 2020-04-25 MED ORDER — ATORVASTATIN CALCIUM 20 MG PO TABS: 20.0000 mg | ORAL_TABLET | Freq: Every day | ORAL | 1 refills | Status: AC

## 2020-05-02 NOTE — Progress Notes (Signed)
I called the patient's listed number to give her the results of the EMG nerve conduction study and to schedule lab work for neuropathy panel but the person lady who picked up the phone yelled at me and asked me to stop calling and hung up the phone.  I called her back but she did not pick up the phone.

## 2020-05-10 ENCOUNTER — Ambulatory Visit: Payer: Medicare Other | Admitting: Cardiology

## 2020-05-10 ENCOUNTER — Other Ambulatory Visit: Payer: Self-pay

## 2020-05-10 VITALS — BP 118/72 | HR 84 | Ht 61.0 in | Wt 126.0 lb

## 2020-05-10 DIAGNOSIS — E782 Mixed hyperlipidemia: Secondary | ICD-10-CM

## 2020-05-10 DIAGNOSIS — I10 Essential (primary) hypertension: Secondary | ICD-10-CM

## 2020-05-10 MED ORDER — HYDROCHLOROTHIAZIDE 25 MG PO TABS: 25.0000 mg | ORAL_TABLET | Freq: Every day | ORAL | 2 refills | Status: DC

## 2020-05-10 MED ORDER — AMLODIPINE BESYLATE 5 MG PO TABS: 5.0000 mg | ORAL_TABLET | Freq: Every day | ORAL | 2 refills | Status: DC

## 2020-05-10 NOTE — Patient Instructions (Signed)

## 2020-05-10 NOTE — Progress Notes (Signed)
Cardiology Office Note:    Date:  05/10/2020   ID:  Sharon Miranda, DOB [birth year], MRN [id number]  PCP:  Mateo Flow, MD  Cardiologist:  Berniece Salines, DO  Electrophysiologist:  None   Referring MD: Mateo Flow, MD   " I am here for a follow up"   History of Present Illness:    Sharon Miranda is a 69 y.o. female with a hx o hypertension, hyperlipidemia, presents for a follow up.   In feburary 2020, I saw the patient twice - the first visit the patient told me that her blood pressure had been normal previously but manually in the office was 150/90 mmHg.  We did give the patient a blood pressure cuff to be able to take her blood pressure daily at home which she reported  averaging systolics of 123456 and diastolics in the [id number].  I therefore increase the patient amlodipine to 10 mg daily and started her also on hydrochlorothiazide 12.5 mg daily.  At her most recent visit, her blood pressure was acceptable. No meds changes were made.  She was able to get her stress test and echo done. The results had been previously called out to patient.    Past Medical History:  Diagnosis Date  . Adult hypothyroidism   . Arthralgia of hand   . Benign essential HTN   . Mixed hyperlipidemia 01/26/2020  . Murmur 01/26/2020  . Precordial pain 01/26/2020  . Recurrent genital herpes     Past Surgical History:  Procedure Laterality Date  . ABDOMINAL HYSTERECTOMY     due to endometriosis  . APPENDECTOMY  1996  . CHOLECYSTECTOMY  2008  . TONSILLECTOMY AND ADENOIDECTOMY      Current Medications: Current Meds  Medication Sig  . amLODipine (NORVASC) 5 MG tablet Take 1 tablet (5 mg total) by mouth daily.  Marland Kitchen aspirin EC 81 MG tablet Take 81 mg by mouth daily.  Marland Kitchen atorvastatin (LIPITOR) 20 MG tablet Take 1 tablet (20 mg total) by mouth daily.  . hydrochlorothiazide (HYDRODIURIL) 25 MG tablet Take 1 tablet (25 mg total) by mouth daily.  Marland Kitchen levothyroxine (SYNTHROID) 137 MCG tablet Take 137 mcg by mouth  daily.  . [DISCONTINUED] amLODipine (NORVASC) 5 MG tablet Take 1 tablet (5 mg total) by mouth daily.  . [DISCONTINUED] hydrochlorothiazide (HYDRODIURIL) 25 MG tablet Take 1 tablet (25 mg total) by mouth daily.     Allergies:   Codeine sulfate [codeine]   Social History   Socioeconomic History  . Marital status: Single    Spouse name: Not on file  . Number of children: Not on file  . Years of education: Not on file  . Highest education level: Not on file  Occupational History  . Not on file  Tobacco Use  . Smoking status: Never Smoker  . Smokeless tobacco: Never Used  Substance and Sexual Activity  . Alcohol use: Yes    Alcohol/week: 1.0 standard drinks    Types: 1 Cans of beer per week  . Drug use: Never  . Sexual activity: Not on file  Other Topics Concern  . Not on file  Social History Narrative  . Not on file   Social Determinants of Health   Financial Resource Strain:   . Difficulty of Paying Living Expenses:   Food Insecurity:   . Worried About Charity fundraiser in the Last Year:   . [place] in the Last Year:   Transportation Needs:   . Lack of  Transportation (Medical):   Marland Kitchen Lack of Transportation (Non-Medical):   Physical Activity:   . Days of Exercise per Week:   . Minutes of Exercise per Session:   Stress:   . Feeling of Stress :   Social Connections:   . Frequency of Communication with Friends and Family:   . Frequency of Social Gatherings with Friends and Family:   . Attends Religious Services:   . Active Member of Clubs or Organizations:   . Attends Archivist Meetings:   Marland Kitchen Marital Status:      Family History: The patient's family history includes Addison's disease in her brother; COPD in her brother; Heart attack in her brother, brother, and mother; Leukemia in her brother.  ROS:   Review of Systems  Constitution: Negative for decreased appetite, fever and weight gain.  HENT: Negative for congestion, ear discharge, hoarse  voice and sore throat.   Eyes: Negative for discharge, redness, vision loss in right eye and visual halos.  Cardiovascular: Negative for chest pain, dyspnea on exertion, leg swelling, orthopnea and palpitations.  Respiratory: Negative for cough, hemoptysis, shortness of breath and snoring.   Endocrine: Negative for heat intolerance and polyphagia.  Hematologic/Lymphatic: Negative for bleeding problem. Does not bruise/bleed easily.  Skin: Negative for flushing, nail changes, rash and suspicious lesions.  Musculoskeletal: Negative for arthritis, joint pain, muscle cramps, myalgias, neck pain and stiffness.  Gastrointestinal: Negative for abdominal pain, bowel incontinence, diarrhea and excessive appetite.  Genitourinary: Negative for decreased libido, genital sores and incomplete emptying.  Neurological: Negative for brief paralysis, focal weakness, headaches and loss of balance.  Psychiatric/Behavioral: Negative for altered mental status, depression and suicidal ideas.  Allergic/Immunologic: Negative for HIV exposure and persistent infections.    EKGs/Labs/Other Studies Reviewed:    The following studies were reviewed today:   EKG: None today  Pharmacologic stress test  The left ventricular ejection fraction is hyperdynamic (>65%).  Nuclear stress EF: 83%.  There was no ST segment deviation noted during stress.  No T wave inversion was noted during stress.  The study is normal.  This is a low risk study.    Echo IMPRESSIONS 03/2020 1. Left ventricular ejection fraction, by estimation, is 60 to 65%. The  left ventricle has normal function. The left ventricle has no regional  wall motion abnormalities. There is mild left ventricular hypertrophy.  Left ventricular diastolic parameters  are consistent with Grade I diastolic dysfunction (impaired relaxation).  2. Right ventricular systolic function is normal. The right ventricular  size is normal. There is normal pulmonary  artery systolic pressure.  3. The mitral valve is normal in structure. No evidence of mitral valve  regurgitation. No evidence of mitral stenosis.  4. The aortic valve is normal in structure. Aortic valve regurgitation is  not visualized. No aortic stenosis is present.  5. The inferior vena cava is normal in size with greater than 50%  respiratory variability, suggesting right atrial pressure of 3 mmHg.   Recent Labs: No results found for requested labs within last 8760 hours.  Recent Lipid Panel No results found for: CHOL, TRIG, HDL, CHOLHDL, VLDL, LDLCALC, LDLDIRECT  Physical Exam:    VS:  BP 118/72   Pulse 84   Ht 5\' 1"  (1.549 m)   Wt 126 lb (57.2 kg)   SpO2 97%   BMI 23.81 kg/m     Wt Readings from Last 3 Encounters:  05/10/20 126 lb (57.2 kg)  03/30/20 129 lb 9.6 oz (58.8 kg)  02/24/20  133 lb (60.3 kg)     GEN: Well nourished, well developed in no acute distress HEENT: Normal NECK: No JVD; No carotid bruits LYMPHATICS: No lymphadenopathy CARDIAC: S1S2 noted,RRR, no murmurs, rubs, gallops RESPIRATORY:  Clear to auscultation without rales, wheezing or rhonchi  ABDOMEN: Soft, non-tender, non-distended, +bowel sounds, no guarding. EXTREMITIES: No edema, No cyanosis, no clubbing MUSCULOSKELETAL:  No deformity  SKIN: Warm and dry NEUROLOGIC:  Alert and oriented x 3, non-focal PSYCHIATRIC:  Normal affect, good insight  ASSESSMENT:    1. Benign essential HTN   2. Mixed hyperlipidemia    PLAN:     The patient to bring her blood pressure report with her.  I was able to review all of her blood pressures from her last visit which showed that she is consistently between 130s and 140s.  I am not going to change her antihypertensive regiment she was stable on amlodipine 5 mg daily as well as hydrochlorothiazide 25 mg daily. Hyperlipidemia-continue patient on Lipitor 20 mg daily.  The patient is in agreement with the above plan. The patient left the office in stable  condition.  The patient will follow up in 1 year or sooner if needed.   Medication Adjustments/Labs and Tests Ordered: Current medicines are reviewed at length with the patient today.  Concerns regarding medicines are outlined above.  No orders of the defined types were placed in this encounter.  Meds ordered this encounter  Medications  . amLODipine (NORVASC) 5 MG tablet    Sig: Take 1 tablet (5 mg total) by mouth daily.    Dispense:  90 tablet    Refill:  2    NEW DOSE CHANGE  . hydrochlorothiazide (HYDRODIURIL) 25 MG tablet    Sig: Take 1 tablet (25 mg total) by mouth daily.    Dispense:  90 tablet    Refill:  2    Patient Instructions  Medication Instructions:  Your physician recommends that you continue on your current medications as directed. Please refer to the Current Medication list given to you today.  *If you need a refill on your cardiac medications before your next appointment, please call your pharmacy*   Lab Work: None If you have labs (blood work) drawn today and your tests are completely normal, you will receive your results only by: Marland Kitchen MyChart Message (if you have MyChart) OR . A paper copy in the mail If you have any lab test that is abnormal or we need to change your treatment, we will call you to review the results.   Testing/Procedures: None   Follow-Up: At [facility], you and your health needs are our priority.  As part of our continuing mission to provide you with exceptional heart care, we have created designated Provider Care Teams.  These Care Teams include your primary Cardiologist (physician) and Advanced Practice Providers (APPs -  Physician Assistants and Nurse Practitioners) who all work together to provide you with the care you need, when you need it.  We recommend signing up for the patient portal called "MyChart".  Sign up information is provided on this After Visit Summary.  MyChart is used to connect with patients for Virtual Visits  (Telemedicine).  Patients are able to view lab/test results, encounter notes, upcoming appointments, etc.  Non-urgent messages can be sent to your provider as well.   To learn more about what you can do with MyChart, go to [email or web address].    Your next appointment:   1 year(s)  The format for your  next appointment:   In Person  Provider:   Berniece Salines, DO   Other Instructions      Adopting a Healthy Lifestyle.  Know what a healthy weight is for you (roughly BMI <25) and aim to maintain this   Aim for 7+ servings of fruits and vegetables daily   65-80+ fluid ounces of water or unsweet tea for healthy kidneys   Limit to max 1 drink of alcohol per day; avoid smoking/tobacco   Limit animal fats in diet for cholesterol and heart health - choose grass fed whenever available   Avoid highly processed foods, and foods high in saturated/trans fats   Aim for low stress - take time to unwind and care for your mental health   Aim for 150 min of moderate intensity exercise weekly for heart health, and weights twice weekly for bone health   Aim for 7-9 hours of sleep daily   When it comes to diets, agreement about the perfect plan isnt easy to find, even among the experts. Experts at the [place] developed an idea known as the Healthy Eating Plate. Just imagine a plate divided into logical, healthy portions.   The emphasis is on diet quality:   Load up on vegetables and fruits - one-half of your plate: Aim for color and variety, and remember that potatoes dont count.   Go for whole grains - one-quarter of your plate: Whole wheat, barley, wheat berries, quinoa, oats, brown rice, and foods made with them. If you want pasta, go with whole wheat pasta.   Protein power - one-quarter of your plate: Fish, chicken, beans, and nuts are all healthy, versatile protein sources. Limit red meat.   The diet, however, does go beyond the plate, offering a few other  suggestions.   Use healthy plant oils, such as olive, canola, soy, corn, sunflower and peanut. Check the labels, and avoid partially hydrogenated oil, which have unhealthy trans fats.   If youre thirsty, drink water. Coffee and tea are good in moderation, but skip sugary drinks and limit milk and dairy products to one or two daily servings.   The type of carbohydrate in the diet is more important than the amount. Some sources of carbohydrates, such as vegetables, fruits, whole grains, and beans-are healthier than others.   Finally, stay active  Signed, Berniece Salines, DO  05/10/2020 2:44 PM    [place]

## 2020-07-03 ENCOUNTER — Ambulatory Visit: Payer: Medicare Other | Admitting: Neurology

## 2020-08-21 ENCOUNTER — Other Ambulatory Visit: Payer: Self-pay | Admitting: Neurology

## 2020-09-06 ENCOUNTER — Other Ambulatory Visit: Payer: Self-pay | Admitting: Neurology

## 2020-09-08 DIAGNOSIS — Z23 Encounter for immunization: Secondary | ICD-10-CM | POA: Diagnosis not present

## 2020-09-27 DIAGNOSIS — R202 Paresthesia of skin: Secondary | ICD-10-CM | POA: Diagnosis not present

## 2020-09-27 DIAGNOSIS — J45909 Unspecified asthma, uncomplicated: Secondary | ICD-10-CM | POA: Diagnosis not present

## 2020-09-27 DIAGNOSIS — Z6822 Body mass index (BMI) 22.0-22.9, adult: Secondary | ICD-10-CM | POA: Diagnosis not present

## 2020-10-03 DIAGNOSIS — R200 Anesthesia of skin: Secondary | ICD-10-CM | POA: Diagnosis not present

## 2020-10-03 DIAGNOSIS — R062 Wheezing: Secondary | ICD-10-CM | POA: Diagnosis not present

## 2020-10-03 DIAGNOSIS — R29898 Other symptoms and signs involving the musculoskeletal system: Secondary | ICD-10-CM | POA: Diagnosis not present

## 2020-10-03 DIAGNOSIS — E876 Hypokalemia: Secondary | ICD-10-CM | POA: Diagnosis not present

## 2020-10-03 DIAGNOSIS — R059 Cough, unspecified: Secondary | ICD-10-CM | POA: Diagnosis not present

## 2020-10-03 DIAGNOSIS — I6782 Cerebral ischemia: Secondary | ICD-10-CM | POA: Diagnosis not present

## 2020-10-03 DIAGNOSIS — R202 Paresthesia of skin: Secondary | ICD-10-CM | POA: Diagnosis not present

## 2020-10-03 DIAGNOSIS — G319 Degenerative disease of nervous system, unspecified: Secondary | ICD-10-CM | POA: Diagnosis not present

## 2020-10-03 DIAGNOSIS — R531 Weakness: Secondary | ICD-10-CM | POA: Diagnosis not present

## 2020-10-09 DIAGNOSIS — Z0000 Encounter for general adult medical examination without abnormal findings: Secondary | ICD-10-CM | POA: Diagnosis not present

## 2020-10-09 DIAGNOSIS — Z79899 Other long term (current) drug therapy: Secondary | ICD-10-CM | POA: Diagnosis not present

## 2020-10-09 DIAGNOSIS — I10 Essential (primary) hypertension: Secondary | ICD-10-CM | POA: Diagnosis not present

## 2020-10-09 DIAGNOSIS — Z6822 Body mass index (BMI) 22.0-22.9, adult: Secondary | ICD-10-CM | POA: Diagnosis not present

## 2020-10-09 DIAGNOSIS — E876 Hypokalemia: Secondary | ICD-10-CM | POA: Diagnosis not present

## 2020-10-09 DIAGNOSIS — E039 Hypothyroidism, unspecified: Secondary | ICD-10-CM | POA: Diagnosis not present

## 2020-10-13 IMAGING — CT CT ANGIO NECK
2 of 11 series · 6 of 35 positions shown · IV contrast (iopamidol)
Comparison: None.

CLINICAL DATA: Transient ischemic attack

EXAM:
CT ANGIOGRAPHY HEAD AND NECK
TECHNIQUE: Multidetector CT imaging of the head and neck was performed using
the standard protocol during bolus administration of intravenous
contrast. Multiplanar CT image reconstructions and MIPs were
obtained to evaluate the vascular anatomy. Carotid stenosis
measurements (when applicable) are obtained utilizing NASCET
criteria, using the distal internal carotid diameter as the
denominator.
CONTRAST:  75mL KVHZMD-9LG IOPAMIDOL (KVHZMD-9LG) INJECTION 76%

[Series 12: brain 3.00 hr40 s3 sag without ibhc · sagittal · non-contrast · 0.35mm/px · 3 of 61 slices shown]
[im 12/61  soft-tissue]
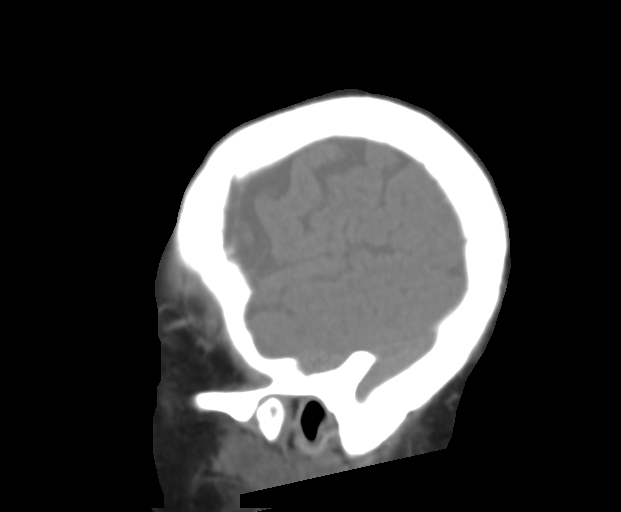
[im 31/61  soft-tissue]
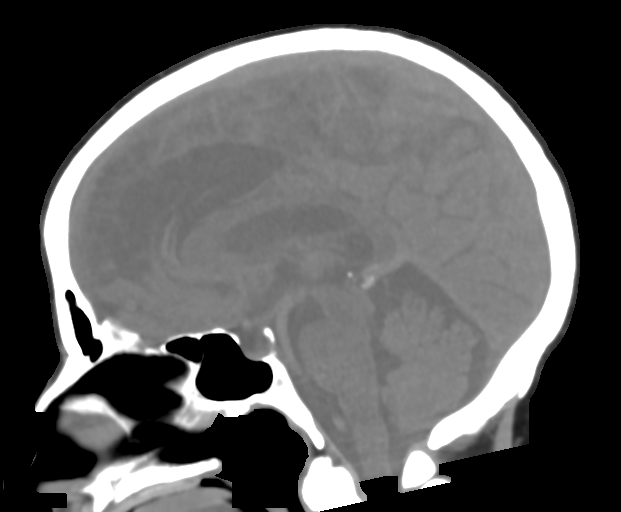
[im 50/61  soft-tissue]
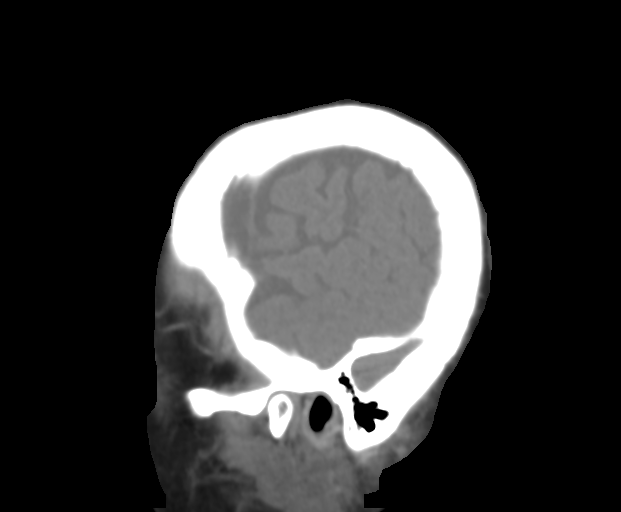

[Series 15: cta head & neck 1.00 hv48 s3 ax thin mips · axial · 0.50mm/px · z∈[-830,-440]mm · 3 of 391 slices shown]
[im 1/391  soft-tissue]
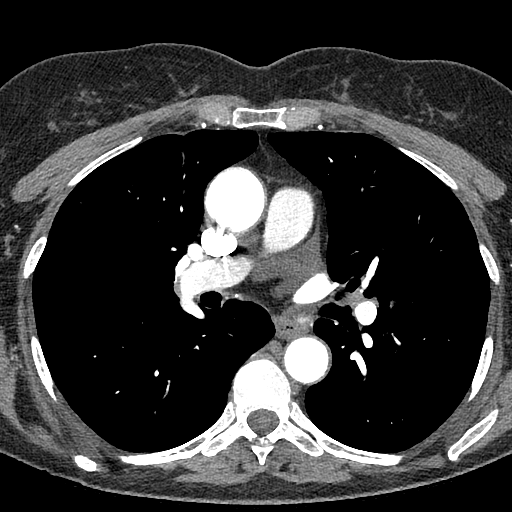
[im 196/391  bone]
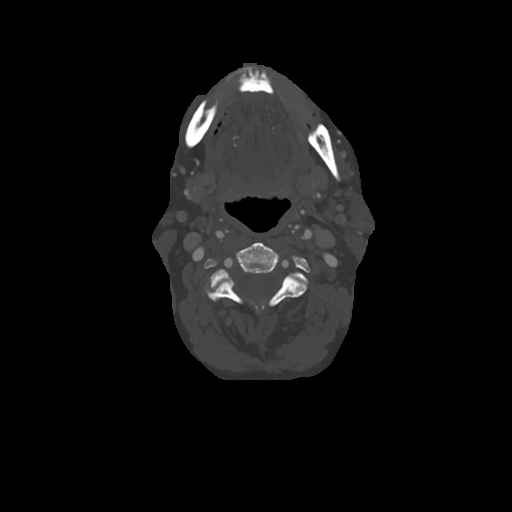
[im 391/391  soft-tissue]
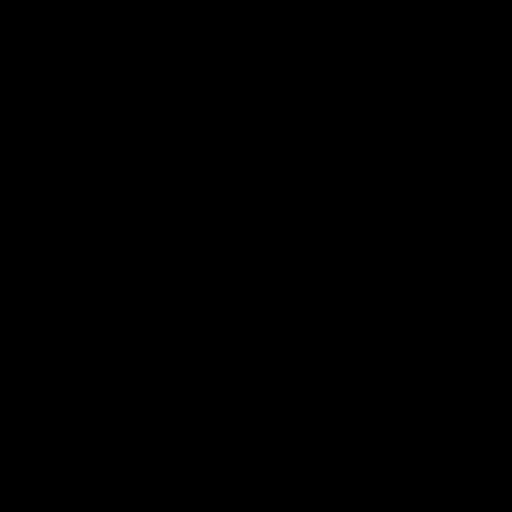

[6 of 35 positions shown; findings below may reference images not displayed]

FINDINGS: CT HEAD FINDINGS

Brain: There is no mass, hemorrhage or extra-axial collection. The
size and configuration of the ventricles and extra-axial CSF spaces
are normal. There is no acute or chronic infarction. The brain
parenchyma is normal.

Skull: The visualized skull base, calvarium and extracranial soft
tissues are normal.

Sinuses/Orbits: No fluid levels or advanced mucosal thickening of
the visualized paranasal sinuses. No mastoid or middle ear effusion.
The orbits are normal.

CTA NECK FINDINGS

SKELETON: There is no bony spinal canal stenosis. No lytic or
blastic lesion.

OTHER NECK: Normal pharynx, larynx and major salivary glands. No
cervical lymphadenopathy. Unremarkable thyroid gland.

UPPER CHEST: Multifocal ground-glass opacity with multifocal
peribronchovascular nodularity. Right middle lobe nodule measures 5
mm.

AORTIC ARCH:

There is no calcific atherosclerosis of the aortic arch. There is no
aneurysm, dissection or hemodynamically significant stenosis of the
visualized portion of the aorta. Conventional 3 vessel aortic
branching pattern. The visualized proximal subclavian arteries are
widely patent.

RIGHT CAROTID SYSTEM: Normal without aneurysm, dissection or
stenosis.

LEFT CAROTID SYSTEM: Normal without aneurysm, dissection or
stenosis.

VERTEBRAL ARTERIES: Left dominant configuration. Both origins are
clearly patent. There is no dissection, occlusion or flow-limiting
stenosis to the skull base (V1-V3 segments).

CTA HEAD FINDINGS

POSTERIOR CIRCULATION:

--Vertebral arteries: Normal V4 segments.

--Inferior cerebellar arteries: Normal.

--Basilar artery: Normal.

--Superior cerebellar arteries: Normal.

--Posterior cerebral arteries (PCA): Normal.

ANTERIOR CIRCULATION:

--Intracranial internal carotid arteries: Normal.

--Anterior cerebral arteries (ACA): Normal. Both A1 segments are
present. Patent anterior communicating artery (a-comm).

--Middle cerebral arteries (MCA): Normal.

VENOUS SINUSES: As permitted by contrast timing, patent.

ANATOMIC VARIANTS: None

Review of the MIP images confirms the above findings.
IMPRESSION: 1. No emergent large vessel occlusion or hemodynamically significant
stenosis of the head or neck.
2. Multifocal ground-glass opacity with multifocal
peribronchovascular nodularity, possibly chronic atypical infection.

## 2020-10-23 DIAGNOSIS — E876 Hypokalemia: Secondary | ICD-10-CM | POA: Diagnosis not present

## 2021-01-21 ENCOUNTER — Other Ambulatory Visit: Payer: Self-pay | Admitting: Cardiology

## 2021-01-22 NOTE — Telephone Encounter (Signed)
Rx refill sent to pharmacy. 

## 2021-03-05 DIAGNOSIS — J301 Allergic rhinitis due to pollen: Secondary | ICD-10-CM | POA: Diagnosis not present

## 2021-03-05 DIAGNOSIS — J309 Allergic rhinitis, unspecified: Secondary | ICD-10-CM | POA: Diagnosis not present

## 2021-03-05 DIAGNOSIS — J45909 Unspecified asthma, uncomplicated: Secondary | ICD-10-CM | POA: Diagnosis not present

## 2021-03-05 DIAGNOSIS — Z6822 Body mass index (BMI) 22.0-22.9, adult: Secondary | ICD-10-CM | POA: Diagnosis not present

## 2021-03-08 DIAGNOSIS — R519 Headache, unspecified: Secondary | ICD-10-CM | POA: Diagnosis not present

## 2021-03-08 DIAGNOSIS — I10 Essential (primary) hypertension: Secondary | ICD-10-CM | POA: Diagnosis not present

## 2021-03-08 DIAGNOSIS — J029 Acute pharyngitis, unspecified: Secondary | ICD-10-CM | POA: Diagnosis not present

## 2021-03-08 DIAGNOSIS — R6883 Chills (without fever): Secondary | ICD-10-CM | POA: Diagnosis not present

## 2021-03-08 DIAGNOSIS — R259 Unspecified abnormal involuntary movements: Secondary | ICD-10-CM | POA: Diagnosis not present

## 2021-03-15 ENCOUNTER — Other Ambulatory Visit: Payer: Self-pay | Admitting: Cardiology

## 2021-03-15 DIAGNOSIS — G510 Bell's palsy: Secondary | ICD-10-CM | POA: Diagnosis not present

## 2021-03-16 DIAGNOSIS — R519 Headache, unspecified: Secondary | ICD-10-CM | POA: Diagnosis not present

## 2021-03-16 DIAGNOSIS — G510 Bell's palsy: Secondary | ICD-10-CM | POA: Diagnosis not present

## 2021-03-16 DIAGNOSIS — Z6822 Body mass index (BMI) 22.0-22.9, adult: Secondary | ICD-10-CM | POA: Diagnosis not present

## 2021-03-16 NOTE — Telephone Encounter (Signed)
Amlodipine 5 mg # 90 only , patient has recall for June

## 2021-03-27 ENCOUNTER — Telehealth: Payer: Self-pay | Admitting: Emergency Medicine

## 2021-03-27 NOTE — Telephone Encounter (Signed)
Called and LVM to return call

## 2021-03-27 NOTE — Telephone Encounter (Signed)
Reached patient by phone.  She stated the opthamologist told her it was Bell's Palsy and would just take a little time to resolve and didn't feel any need to come in and be seen by neurologist.  I informed we received an urgent request to be seen from [place] but she politely declined.  Stated if it worsened or did not start to improve she would call for an appointment.    Patient denied further questions, verbalized understanding and expressed appreciation for the phone call.

## 2021-03-29 DIAGNOSIS — Z6822 Body mass index (BMI) 22.0-22.9, adult: Secondary | ICD-10-CM | POA: Diagnosis not present

## 2021-03-29 DIAGNOSIS — Z8669 Personal history of other diseases of the nervous system and sense organs: Secondary | ICD-10-CM | POA: Diagnosis not present

## 2021-03-29 DIAGNOSIS — Z9181 History of falling: Secondary | ICD-10-CM | POA: Diagnosis not present

## 2021-04-02 ENCOUNTER — Other Ambulatory Visit: Payer: Self-pay

## 2021-04-04 ENCOUNTER — Other Ambulatory Visit: Payer: Self-pay

## 2021-04-04 ENCOUNTER — Encounter: Payer: Self-pay | Admitting: Cardiology

## 2021-04-04 ENCOUNTER — Ambulatory Visit: Payer: Medicare Other | Admitting: Cardiology

## 2021-04-04 VITALS — BP 132/72 | HR 83 | Ht 61.0 in | Wt 126.0 lb

## 2021-04-04 DIAGNOSIS — I10 Essential (primary) hypertension: Secondary | ICD-10-CM

## 2021-04-04 DIAGNOSIS — Z1231 Encounter for screening mammogram for malignant neoplasm of breast: Secondary | ICD-10-CM | POA: Diagnosis not present

## 2021-04-04 NOTE — Progress Notes (Signed)
Cardiology Office Note:    Date:  04/04/2021   ID:  Sharon Miranda, DOB 1951/01/27, MRN [id number]  PCP:  Mateo Flow, MD  Cardiologist:  Berniece Salines, DO  Electrophysiologist:  None   Referring MD: Mateo Flow, MD   I am doing fine  History of Present Illness:    Sharon Miranda is a 70 y.o. female with a hx of history of hypertension, hyperlipidemia is here today for follow-up visit. \ I last saw the patient in June 2021 at that time she appeared to be doing well from a cardiovascular standpoint.  I did not change any medication continue the patient on her amlodipine 5 mg as well as her hydrochlorothiazide.  No further complaints today.  Past Medical History:  Diagnosis Date  . Adult hypothyroidism   . Arthralgia of hand   . Benign essential HTN   . Mixed hyperlipidemia 01/26/2020  . Murmur 01/26/2020  . Precordial pain 01/26/2020  . Recurrent genital herpes     Past Surgical History:  Procedure Laterality Date  . ABDOMINAL HYSTERECTOMY     due to endometriosis  . APPENDECTOMY  1996  . CHOLECYSTECTOMY  2008  . TONSILLECTOMY AND ADENOIDECTOMY      Current Medications: Current Meds  Medication Sig  . amLODipine (NORVASC) 5 MG tablet TAKE 1 TABLET BY MOUTH  DAILY  . aspirin EC 81 MG tablet Take 81 mg by mouth daily.  Marland Kitchen atorvastatin (LIPITOR) 20 MG tablet Take 1 tablet (20 mg total) by mouth daily.  . fluticasone (FLONASE) 50 MCG/ACT nasal spray Place 2 sprays into both nostrils daily.  . hydrochlorothiazide (HYDRODIURIL) 25 MG tablet TAKE 1 TABLET BY MOUTH  DAILY  . levothyroxine (SYNTHROID) 137 MCG tablet Take 137 mcg by mouth daily.     Allergies:   Codeine sulfate [codeine]   Social History   Socioeconomic History  . Marital status: Single    Spouse name: Not on file  . Number of children: Not on file  . Years of education: Not on file  . Highest education level: Not on file  Occupational History  . Not on file  Tobacco Use  . Smoking status: Never  Smoker  . Smokeless tobacco: Never Used  Substance and Sexual Activity  . Alcohol use: Yes    Alcohol/week: 1.0 standard drink    Types: 1 Cans of beer per week  . Drug use: Never  . Sexual activity: Not on file  Other Topics Concern  . Not on file  Social History Narrative  . Not on file   Social Determinants of Health   Financial Resource Strain: Not on file  Food Insecurity: Not on file  Transportation Needs: Not on file  Physical Activity: Not on file  Stress: Not on file  Social Connections: Not on file     Family History: The patient's family history includes Addison's disease in her brother; COPD in her brother; Heart attack in her brother, brother, and mother; Leukemia in her brother.  ROS:   Review of Systems  Constitution: Negative for decreased appetite, fever and weight gain.  HENT: Negative for congestion, ear discharge, hoarse voice and sore throat.   Eyes: Negative for discharge, redness, vision loss in right eye and visual halos.  Cardiovascular: Negative for chest pain, dyspnea on exertion, leg swelling, orthopnea and palpitations.  Respiratory: Negative for cough, hemoptysis, shortness of breath and snoring.   Endocrine: Negative for heat intolerance and polyphagia.  Hematologic/Lymphatic: Negative for bleeding problem. Does not  bruise/bleed easily.  Skin: Negative for flushing, nail changes, rash and suspicious lesions.  Musculoskeletal: Negative for arthritis, joint pain, muscle cramps, myalgias, neck pain and stiffness.  Gastrointestinal: Negative for abdominal pain, bowel incontinence, diarrhea and excessive appetite.  Genitourinary: Negative for decreased libido, genital sores and incomplete emptying.  Neurological: Negative for brief paralysis, focal weakness, headaches and loss of balance.  Psychiatric/Behavioral: Negative for altered mental status, depression and suicidal ideas.  Allergic/Immunologic: Negative for HIV exposure and persistent  infections.    EKGs/Labs/Other Studies Reviewed:    The following studies were reviewed today:   EKG:  The ekg ordered today demonstrates sinus rhythm, heart rate 83 bpm with possible left atrial enlargement.  Compared to prior EKG no significant change.  Pharmacologic stress test  The left ventricular ejection fraction is hyperdynamic (>65%).  Nuclear stress EF: 83%.  There was no ST segment deviation noted during stress.  No T wave inversion was noted during stress.  The study is normal.  This is a low risk study.   Echo IMPRESSIONS 03/2020 1. Left ventricular ejection fraction, by estimation, is 60 to 65%. The  left ventricle has normal function. The left ventricle has no regional  wall motion abnormalities. There is mild left ventricular hypertrophy.  Left ventricular diastolic parameters  are consistent with Grade I diastolic dysfunction (impaired relaxation).  2. Right ventricular systolic function is normal. The right ventricular  size is normal. There is normal pulmonary artery systolic pressure.  3. The mitral valve is normal in structure. No evidence of mitral valve  regurgitation. No evidence of mitral stenosis.  4. The aortic valve is normal in structure. Aortic valve regurgitation is  not visualized. No aortic stenosis is present.  5. The inferior vena cava is normal in size with greater than 50%  respiratory variability, suggesting right atrial pressure of 3 mmHg.    Recent Labs: No results found for requested labs within last 8760 hours.  Recent Lipid Panel No results found for: CHOL, TRIG, HDL, CHOLHDL, VLDL, LDLCALC, LDLDIRECT  Physical Exam:    VS:  BP 132/72   Pulse 83   Ht 5\' 1"  (1.549 m)   Wt 126 lb (57.2 kg)   SpO2 97%   BMI 23.81 kg/m     Wt Readings from Last 3 Encounters:  04/04/21 126 lb (57.2 kg)  05/10/20 126 lb (57.2 kg)  03/30/20 129 lb 9.6 oz (58.8 kg)     GEN: Well nourished, well developed in no acute  distress HEENT: Normal NECK: No JVD; No carotid bruits LYMPHATICS: No lymphadenopathy CARDIAC: S1S2 noted,RRR, no murmurs, rubs, gallops RESPIRATORY:  Clear to auscultation without rales, wheezing or rhonchi  ABDOMEN: Soft, non-tender, non-distended, +bowel sounds, no guarding. EXTREMITIES: No edema, No cyanosis, no clubbing MUSCULOSKELETAL:  No deformity  SKIN: Warm and dry NEUROLOGIC:  Alert and oriented x 3, non-focal PSYCHIATRIC:  Normal affect, good insight  ASSESSMENT:    1. Hypertension, unspecified type    PLAN:     She appears to be doing well from a cardiovascular standpoint.  No changes will be made to her medication regimen.  Blood pressure is acceptable, continue with current antihypertensive regimen which includes amlodipine 5 mg daily and hydrochlorothiazide 25 mg daily.  Hyperlipidemia - continue with current statin medication.  The patient is in agreement with the above plan. The patient left the office in stable condition.  The patient will follow up in   Medication Adjustments/Labs and Tests Ordered: Current medicines are reviewed at length  with the patient today.  Concerns regarding medicines are outlined above.  Orders Placed This Encounter  Procedures  . EKG 12-Lead   No orders of the defined types were placed in this encounter.   Patient Instructions  Medication Instructions:  Your physician recommends that you continue on your current medications as directed. Please refer to the Current Medication list given to you today.  *If you need a refill on your cardiac medications before your next appointment, please call your pharmacy*   Lab Work: None If you have labs (blood work) drawn today and your tests are completely normal, you will receive your results only by: Marland Kitchen MyChart Message (if you have MyChart) OR . A paper copy in the mail If you have any lab test that is abnormal or we need to change your treatment, we will call you to review the  results.   Testing/Procedures: None   Follow-Up: At [facility], you and your health needs are our priority.  As part of our continuing mission to provide you with exceptional heart care, we have created designated Provider Care Teams.  These Care Teams include your primary Cardiologist (physician) and Advanced Practice Providers (APPs -  Physician Assistants and Nurse Practitioners) who all work together to provide you with the care you need, when you need it.  We recommend signing up for the patient portal called "MyChart".  Sign up information is provided on this After Visit Summary.  MyChart is used to connect with patients for Virtual Visits (Telemedicine).  Patients are able to view lab/test results, encounter notes, upcoming appointments, etc.  Non-urgent messages can be sent to your provider as well.   To learn more about what you can do with MyChart, go to [email or web address].    Your next appointment:   As needed  The format for your next appointment:   In Person  Provider:   Berniece Salines, DO   Other Instructions      Adopting a Healthy Lifestyle.  Know what a healthy weight is for you (roughly BMI <25) and aim to maintain this   Aim for 7+ servings of fruits and vegetables daily   65-80+ fluid ounces of water or unsweet tea for healthy kidneys   Limit to max 1 drink of alcohol per day; avoid smoking/tobacco   Limit animal fats in diet for cholesterol and heart health - choose grass fed whenever available   Avoid highly processed foods, and foods high in saturated/trans fats   Aim for low stress - take time to unwind and care for your mental health   Aim for 150 min of moderate intensity exercise weekly for heart health, and weights twice weekly for bone health   Aim for 7-9 hours of sleep daily   When it comes to diets, agreement about the perfect plan isnt easy to find, even among the experts. Experts at the [employer] developed  an idea known as the Healthy Eating Plate. Just imagine a plate divided into logical, healthy portions.   The emphasis is on diet quality:   Load up on vegetables and fruits - one-half of your plate: Aim for color and variety, and remember that potatoes dont count.   Go for whole grains - one-quarter of your plate: Whole wheat, barley, wheat berries, quinoa, oats, brown rice, and foods made with them. If you want pasta, go with whole wheat pasta.   Protein power - one-quarter of your plate: Fish, chicken, beans, and nuts are all  healthy, versatile protein sources. Limit red meat.   The diet, however, does go beyond the plate, offering a few other suggestions.   Use healthy plant oils, such as olive, canola, soy, corn, sunflower and peanut. Check the labels, and avoid partially hydrogenated oil, which have unhealthy trans fats.   If youre thirsty, drink water. Coffee and tea are good in moderation, but skip sugary drinks and limit milk and dairy products to one or two daily servings.   The type of carbohydrate in the diet is more important than the amount. Some sources of carbohydrates, such as vegetables, fruits, whole grains, and beans-are healthier than others.   Finally, stay active  Signed, Berniece Salines, DO  04/04/2021 4:47 PM    [facility]

## 2021-04-04 NOTE — Patient Instructions (Signed)

## 2021-04-12 DIAGNOSIS — G510 Bell's palsy: Secondary | ICD-10-CM | POA: Diagnosis not present

## 2021-04-18 DIAGNOSIS — D225 Melanocytic nevi of trunk: Secondary | ICD-10-CM | POA: Diagnosis not present

## 2021-04-18 DIAGNOSIS — L821 Other seborrheic keratosis: Secondary | ICD-10-CM | POA: Diagnosis not present

## 2021-04-18 DIAGNOSIS — L820 Inflamed seborrheic keratosis: Secondary | ICD-10-CM | POA: Diagnosis not present

## 2021-05-01 DIAGNOSIS — H8110 Benign paroxysmal vertigo, unspecified ear: Secondary | ICD-10-CM | POA: Diagnosis not present

## 2021-05-01 DIAGNOSIS — Z6821 Body mass index (BMI) 21.0-21.9, adult: Secondary | ICD-10-CM | POA: Diagnosis not present

## 2021-05-10 DIAGNOSIS — H8110 Benign paroxysmal vertigo, unspecified ear: Secondary | ICD-10-CM | POA: Diagnosis not present

## 2021-05-10 DIAGNOSIS — Z6822 Body mass index (BMI) 22.0-22.9, adult: Secondary | ICD-10-CM | POA: Diagnosis not present

## 2021-06-05 ENCOUNTER — Other Ambulatory Visit: Payer: Self-pay | Admitting: Cardiology

## 2021-06-06 NOTE — Telephone Encounter (Signed)
Rx approve with instructions to arrange an appt 2nd attempt

## 2021-06-26 DIAGNOSIS — Z23 Encounter for immunization: Secondary | ICD-10-CM | POA: Diagnosis not present

## 2021-07-09 ENCOUNTER — Other Ambulatory Visit: Payer: Self-pay | Admitting: Cardiology

## 2021-07-10 NOTE — Telephone Encounter (Signed)
Hydrochlorothiazide 25 mg # 90 x 3 refills sent to [facility] ([place], [place]

## 2021-08-20 DIAGNOSIS — Z23 Encounter for immunization: Secondary | ICD-10-CM | POA: Diagnosis not present

## 2021-09-11 DIAGNOSIS — Z23 Encounter for immunization: Secondary | ICD-10-CM | POA: Diagnosis not present

## 2021-11-26 DIAGNOSIS — E039 Hypothyroidism, unspecified: Secondary | ICD-10-CM | POA: Diagnosis not present

## 2021-11-26 DIAGNOSIS — Z79899 Other long term (current) drug therapy: Secondary | ICD-10-CM | POA: Diagnosis not present

## 2021-11-26 DIAGNOSIS — E7800 Pure hypercholesterolemia, unspecified: Secondary | ICD-10-CM | POA: Diagnosis not present

## 2021-11-26 DIAGNOSIS — Z0000 Encounter for general adult medical examination without abnormal findings: Secondary | ICD-10-CM | POA: Diagnosis not present

## 2021-11-26 DIAGNOSIS — I10 Essential (primary) hypertension: Secondary | ICD-10-CM | POA: Diagnosis not present

## 2021-11-26 DIAGNOSIS — Z6823 Body mass index (BMI) 23.0-23.9, adult: Secondary | ICD-10-CM | POA: Diagnosis not present

## 2021-12-17 DIAGNOSIS — Z6823 Body mass index (BMI) 23.0-23.9, adult: Secondary | ICD-10-CM | POA: Diagnosis not present

## 2021-12-17 DIAGNOSIS — H8110 Benign paroxysmal vertigo, unspecified ear: Secondary | ICD-10-CM | POA: Diagnosis not present

## 2022-01-16 DIAGNOSIS — J329 Chronic sinusitis, unspecified: Secondary | ICD-10-CM | POA: Diagnosis not present

## 2022-01-16 DIAGNOSIS — J40 Bronchitis, not specified as acute or chronic: Secondary | ICD-10-CM | POA: Diagnosis not present

## 2022-01-23 DIAGNOSIS — J40 Bronchitis, not specified as acute or chronic: Secondary | ICD-10-CM | POA: Diagnosis not present

## 2022-01-23 DIAGNOSIS — J329 Chronic sinusitis, unspecified: Secondary | ICD-10-CM | POA: Diagnosis not present

## 2022-01-23 DIAGNOSIS — Z6822 Body mass index (BMI) 22.0-22.9, adult: Secondary | ICD-10-CM | POA: Diagnosis not present

## 2022-03-16 DIAGNOSIS — R059 Cough, unspecified: Secondary | ICD-10-CM | POA: Diagnosis not present

## 2022-03-16 DIAGNOSIS — J209 Acute bronchitis, unspecified: Secondary | ICD-10-CM | POA: Diagnosis not present

## 2022-03-16 DIAGNOSIS — Z20822 Contact with and (suspected) exposure to covid-19: Secondary | ICD-10-CM | POA: Diagnosis not present

## 2022-03-20 DIAGNOSIS — Z6822 Body mass index (BMI) 22.0-22.9, adult: Secondary | ICD-10-CM | POA: Diagnosis not present

## 2022-03-20 DIAGNOSIS — J329 Chronic sinusitis, unspecified: Secondary | ICD-10-CM | POA: Diagnosis not present

## 2022-03-20 DIAGNOSIS — J40 Bronchitis, not specified as acute or chronic: Secondary | ICD-10-CM | POA: Diagnosis not present

## 2022-04-19 DIAGNOSIS — J329 Chronic sinusitis, unspecified: Secondary | ICD-10-CM | POA: Diagnosis not present

## 2022-04-30 DIAGNOSIS — Z1231 Encounter for screening mammogram for malignant neoplasm of breast: Secondary | ICD-10-CM | POA: Diagnosis not present

## 2022-05-20 DIAGNOSIS — J329 Chronic sinusitis, unspecified: Secondary | ICD-10-CM | POA: Diagnosis not present

## 2022-06-19 DIAGNOSIS — J40 Bronchitis, not specified as acute or chronic: Secondary | ICD-10-CM | POA: Diagnosis not present

## 2022-06-19 DIAGNOSIS — J329 Chronic sinusitis, unspecified: Secondary | ICD-10-CM | POA: Diagnosis not present

## 2022-07-20 DIAGNOSIS — J40 Bronchitis, not specified as acute or chronic: Secondary | ICD-10-CM | POA: Diagnosis not present

## 2022-07-20 DIAGNOSIS — J329 Chronic sinusitis, unspecified: Secondary | ICD-10-CM | POA: Diagnosis not present

## 2022-08-20 DIAGNOSIS — M25572 Pain in left ankle and joints of left foot: Secondary | ICD-10-CM | POA: Diagnosis not present

## 2022-08-20 DIAGNOSIS — J329 Chronic sinusitis, unspecified: Secondary | ICD-10-CM | POA: Diagnosis not present

## 2022-08-20 DIAGNOSIS — R600 Localized edema: Secondary | ICD-10-CM | POA: Diagnosis not present

## 2022-08-20 DIAGNOSIS — J40 Bronchitis, not specified as acute or chronic: Secondary | ICD-10-CM | POA: Diagnosis not present

## 2022-08-20 DIAGNOSIS — S93492A Sprain of other ligament of left ankle, initial encounter: Secondary | ICD-10-CM | POA: Diagnosis not present

## 2022-09-18 DIAGNOSIS — Z23 Encounter for immunization: Secondary | ICD-10-CM | POA: Diagnosis not present

## 2022-09-18 DIAGNOSIS — S93402D Sprain of unspecified ligament of left ankle, subsequent encounter: Secondary | ICD-10-CM | POA: Diagnosis not present

## 2022-09-18 DIAGNOSIS — Z6823 Body mass index (BMI) 23.0-23.9, adult: Secondary | ICD-10-CM | POA: Diagnosis not present

## 2022-09-19 DIAGNOSIS — J40 Bronchitis, not specified as acute or chronic: Secondary | ICD-10-CM | POA: Diagnosis not present

## 2022-09-19 DIAGNOSIS — J329 Chronic sinusitis, unspecified: Secondary | ICD-10-CM | POA: Diagnosis not present

## 2022-10-20 DIAGNOSIS — J40 Bronchitis, not specified as acute or chronic: Secondary | ICD-10-CM | POA: Diagnosis not present

## 2022-10-20 DIAGNOSIS — J329 Chronic sinusitis, unspecified: Secondary | ICD-10-CM | POA: Diagnosis not present

## 2022-10-22 DIAGNOSIS — Z23 Encounter for immunization: Secondary | ICD-10-CM | POA: Diagnosis not present

## 2022-11-19 DIAGNOSIS — J40 Bronchitis, not specified as acute or chronic: Secondary | ICD-10-CM | POA: Diagnosis not present

## 2022-11-19 DIAGNOSIS — J329 Chronic sinusitis, unspecified: Secondary | ICD-10-CM | POA: Diagnosis not present

## 2022-12-20 DIAGNOSIS — J329 Chronic sinusitis, unspecified: Secondary | ICD-10-CM | POA: Diagnosis not present

## 2022-12-20 DIAGNOSIS — J40 Bronchitis, not specified as acute or chronic: Secondary | ICD-10-CM | POA: Diagnosis not present

## 2023-01-10 DIAGNOSIS — D485 Neoplasm of uncertain behavior of skin: Secondary | ICD-10-CM | POA: Diagnosis not present

## 2023-01-20 DIAGNOSIS — J329 Chronic sinusitis, unspecified: Secondary | ICD-10-CM | POA: Diagnosis not present

## 2023-01-20 DIAGNOSIS — J40 Bronchitis, not specified as acute or chronic: Secondary | ICD-10-CM | POA: Diagnosis not present

## 2023-02-18 DIAGNOSIS — J329 Chronic sinusitis, unspecified: Secondary | ICD-10-CM | POA: Diagnosis not present

## 2023-02-18 DIAGNOSIS — J40 Bronchitis, not specified as acute or chronic: Secondary | ICD-10-CM | POA: Diagnosis not present

## 2023-03-21 DIAGNOSIS — J40 Bronchitis, not specified as acute or chronic: Secondary | ICD-10-CM | POA: Diagnosis not present

## 2023-03-21 DIAGNOSIS — J329 Chronic sinusitis, unspecified: Secondary | ICD-10-CM | POA: Diagnosis not present

## 2023-04-17 DIAGNOSIS — Z6822 Body mass index (BMI) 22.0-22.9, adult: Secondary | ICD-10-CM | POA: Diagnosis not present

## 2023-04-17 DIAGNOSIS — J45909 Unspecified asthma, uncomplicated: Secondary | ICD-10-CM | POA: Diagnosis not present

## 2023-04-17 DIAGNOSIS — J329 Chronic sinusitis, unspecified: Secondary | ICD-10-CM | POA: Diagnosis not present

## 2023-04-17 DIAGNOSIS — J40 Bronchitis, not specified as acute or chronic: Secondary | ICD-10-CM | POA: Diagnosis not present

## 2023-05-02 DIAGNOSIS — Z1231 Encounter for screening mammogram for malignant neoplasm of breast: Secondary | ICD-10-CM | POA: Diagnosis not present

## 2023-05-15 ENCOUNTER — Ambulatory Visit: Payer: Medicare Other | Admitting: Cardiology

## 2023-07-14 DIAGNOSIS — E039 Hypothyroidism, unspecified: Secondary | ICD-10-CM | POA: Diagnosis not present

## 2023-07-14 DIAGNOSIS — M8580 Other specified disorders of bone density and structure, unspecified site: Secondary | ICD-10-CM | POA: Diagnosis not present

## 2023-07-14 DIAGNOSIS — J849 Interstitial pulmonary disease, unspecified: Secondary | ICD-10-CM | POA: Diagnosis not present

## 2023-07-14 DIAGNOSIS — Z0000 Encounter for general adult medical examination without abnormal findings: Secondary | ICD-10-CM | POA: Diagnosis not present

## 2023-07-14 DIAGNOSIS — E7800 Pure hypercholesterolemia, unspecified: Secondary | ICD-10-CM | POA: Diagnosis not present

## 2023-07-14 DIAGNOSIS — Z780 Asymptomatic menopausal state: Secondary | ICD-10-CM | POA: Diagnosis not present

## 2023-07-14 DIAGNOSIS — Z6822 Body mass index (BMI) 22.0-22.9, adult: Secondary | ICD-10-CM | POA: Diagnosis not present

## 2023-07-14 DIAGNOSIS — I10 Essential (primary) hypertension: Secondary | ICD-10-CM | POA: Diagnosis not present

## 2023-07-14 DIAGNOSIS — Z79899 Other long term (current) drug therapy: Secondary | ICD-10-CM | POA: Diagnosis not present

## 2023-07-14 DIAGNOSIS — Z9181 History of falling: Secondary | ICD-10-CM | POA: Diagnosis not present

## 2023-07-23 DIAGNOSIS — L638 Other alopecia areata: Secondary | ICD-10-CM | POA: Diagnosis not present

## 2023-08-19 ENCOUNTER — Ambulatory Visit: Payer: Medicare Other | Admitting: Internal Medicine

## 2023-08-19 ENCOUNTER — Encounter: Payer: Self-pay | Admitting: Internal Medicine

## 2023-08-19 VITALS — BP 120/70 | HR 90 | Ht 61.0 in | Wt 122.4 lb

## 2023-08-19 DIAGNOSIS — R062 Wheezing: Secondary | ICD-10-CM | POA: Diagnosis not present

## 2023-08-19 DIAGNOSIS — R918 Other nonspecific abnormal finding of lung field: Secondary | ICD-10-CM

## 2023-08-19 NOTE — Patient Instructions (Addendum)
ICD-10-CM   1. Wheezing  R06.2       Although you are asymptomatic you actually have wheezing.  I do not think you have crackles which is otherwise called "rattle".  Concern is that he might have subclinical airway disease such as asthma  Plan - Do exhaled nitric oxide test today -Do full pulmonary function test first available - Do high-resolution CT chest supine and prone inspiratory next-door to volume -Do blood work RAST allergy panel and CBC with differential and blood IgE today  Follow-up - Return to see nurse practitioner in 4-8 weeks to discuss test results

## 2023-08-19 NOTE — Progress Notes (Signed)
OV 08/19/2023  Subjective:  Patient ID: Sharon Miranda, female , DOB: December 27, 1950 , age 72 y.o. , MRN: [id number] , ADDRESS: [address] [place] [address] PCP Sharon Auer, MD Patient Care Team: Sharon Auer, MD as PCP - General (Family Medicine) Sharon Ripple, DO as PCP - Cardiology (Cardiology)  This Provider for this visit: Treatment Team:  Attending Provider: Kalman Shan, MD    08/19/2023 -   Chief Complaint  Patient presents with   Consult    Consult for possible ILD, rattle in chest     HPI [name] 72 y.o. -referred for a new consult because primary care physician heard a rattle in the chest.  She said this was recently a few months ago but she says she is completely asymptomatic.  She says she never gets sick she gets a flu shot and vaccines and never reacts to it.  There is no hospitalizations no ER visits no urgent care visits no surgeries.  She walks half mile per day.  Therefore she is surprised.  On exam she actually has a wheeze.  She did have a chest x-ray in the outside health system in 2023.  I am unable to pull the image but it shows she has potential chronic bronchitis.  She has never had COVID  Nevertheless she was sent in ILD questionnaire and from a exposure standpoint  Symptoms: No shortness of breath no coughing no wheezing no nausea vomiting or diarrhea  Family history of lung disease: Denies  Exposure history: No smoking no marijuana no cocaine no intravenous drug use  Home environment history: Lives in a 74 year old home by herself.  She says she is divorced.  No kids.  No organic exposures in the house.  She used to work in [employer] and then she is retired.  Occupational exposures extensive organic and inorganic antigen exposure history is negative  Medication history: Negative for any drugs that cause ILD or eosinophilic pneumonias.-   FENO 08/19/23  - could NOT do PFT      No data to display            LAB  RESULTS last 96 hours No results found.  LAB RESULTS last 90 days No results found for this or any previous visit (from the past 2160 hour(s)).       has a past medical history of Adult hypothyroidism, Arthralgia of hand, Benign essential HTN, Mixed hyperlipidemia (01/26/2020), Murmur (01/26/2020), Precordial pain (01/26/2020), and Recurrent genital herpes.   reports that she has never smoked. She has never used smokeless tobacco.  Past Surgical History:  Procedure Laterality Date   ABDOMINAL HYSTERECTOMY     due to endometriosis   APPENDECTOMY  1996   CHOLECYSTECTOMY  2008   TONSILLECTOMY AND ADENOIDECTOMY      Allergies  Allergen Reactions   Codeine Sulfate [Codeine] Itching    Immunization History  Administered Date(s) Administered   Influenza-Unspecified 10/09/2018    Family History  Problem Relation Age of Onset   Heart attack Mother    Heart attack Brother    COPD Brother    Heart attack Brother    Addison's disease Brother    Leukemia Brother      Current Outpatient Medications:    amLODipine (NORVASC) 5 MG tablet, TAKE 1 TABLET BY MOUTH  DAILY, Disp: 15 tablet, Rfl: 0   aspirin EC 81 MG tablet, Take 81 mg by mouth daily., Disp: , Rfl:    fluticasone (  FLONASE) 50 MCG/ACT nasal spray, Place 2 sprays into both nostrils daily., Disp: , Rfl:    hydrochlorothiazide (HYDRODIURIL) 25 MG tablet, TAKE 1 TABLET BY MOUTH  DAILY, Disp: 90 tablet, Rfl: 3   levothyroxine (SYNTHROID) 137 MCG tablet, Take 137 mcg by mouth daily., Disp: , Rfl:    atorvastatin (LIPITOR) 20 MG tablet, Take 1 tablet (20 mg total) by mouth daily., Disp: 90 tablet, Rfl: 1      Objective:   Vitals:   08/19/23 1536  BP: 120/70  Pulse: 90  SpO2: 93%  Weight: 122 lb 6.4 oz (55.5 kg)  Height: 5\' 1"  (1.549 m)    Estimated body mass index is 23.13 kg/m as calculated from the following:   Height as of this encounter: 5\' 1"  (1.549 m).   Weight as of this encounter: 122 lb 6.4 oz (55.5  kg).  @WEIGHTCHANGE @  [employer]   08/19/23 1536  Weight: 122 lb 6.4 oz (55.5 kg)     Physical Exam   General: No distress. Lookks well O2 at rest: no Cane present: no Sitting in wheel chair: no Frail: no Obese: no Neuro: Alert and Oriented x 3. GCS 15. Speech normal Psych: Pleasant Resp:  Barrel Chest - no.  Wheeze - yes diffuse, Crackles - no, No overt respiratory distress CVS: Normal heart sounds. Murmurs - no Ext: Stigmata of Connective Tissue Disease - no HEENT: Normal upper airway. PEERL +. No post nasal drip        Assessment:       ICD-10-CM   1. Wheezing  R06.2     2. Lung field abnormal finding on examination  R91.8          Plan:     Patient Instructions     ICD-10-CM   1. Wheezing  R06.2       Although you are asymptomatic you actually have wheezing.  I do not think you have crackles which is otherwise called "rattle".  Concern is that he might have subclinical airway disease such as asthma  Plan - Do exhaled nitric oxide test today -Do full pulmonary function test first available - Do high-resolution CT chest supine and prone inspiratory next-door to volume -Do blood work RAST allergy panel and CBC with differential and blood IgE today  Follow-up - Return to see nurse practitioner in 4-8 weeks to discuss test results   FOLLOWUP Return in about 8 weeks (around 10/14/2023) for with any of the APPS, Face to Face OR Video Visit.    SIGNATURE    Dr. Kalman Miranda, M.D., F.C.C.P,  Pulmonary and Critical Care Medicine Staff Physician, [facility] Center Director - Interstitial Lung Disease  Program  Pulmonary Fibrosis [facility] [place], [place], [address]  Pager: [phone], If no answer or between  15:00h - 7:00h: call [phone] Telephone: [phone]  4:07 PM 08/19/2023

## 2023-08-20 LAB — CBC WITH DIFFERENTIAL/PLATELET
Basophils Absolute: 0.1 10*3/uL (ref 0.0–0.1)
Basophils Relative: 1.6 % (ref 0.0–3.0)
Eosinophils Absolute: 0.2 10*3/uL (ref 0.0–0.7)
Eosinophils Relative: 2.6 % (ref 0.0–5.0)
HCT: 43.7 % (ref 36.0–46.0)
Hemoglobin: 14.4 g/dL (ref 12.0–15.0)
Lymphocytes Relative: 24.6 % (ref 12.0–46.0)
Lymphs Abs: 1.9 10*3/uL (ref 0.7–4.0)
MCHC: 32.9 g/dL (ref 30.0–36.0)
MCV: 86.4 fl (ref 78.0–100.0)
Monocytes Absolute: 0.7 10*3/uL (ref 0.1–1.0)
Monocytes Relative: 9.6 % (ref 3.0–12.0)
Neutro Abs: 4.8 10*3/uL (ref 1.4–7.7)
Neutrophils Relative %: 61.6 % (ref 43.0–77.0)
Platelets: 335 10*3/uL (ref 150.0–400.0)
RBC: 5.06 Mil/uL (ref 3.87–5.11)
RDW: 13.5 % (ref 11.5–15.5)
WBC: 7.8 10*3/uL (ref 4.0–10.5)

## 2023-08-20 LAB — IGE: IgE (Immunoglobulin E), Serum: 82 kU/L (ref <=114)

## 2023-08-21 DIAGNOSIS — L649 Androgenic alopecia, unspecified: Secondary | ICD-10-CM | POA: Diagnosis not present

## 2023-08-21 DIAGNOSIS — Z23 Encounter for immunization: Secondary | ICD-10-CM | POA: Diagnosis not present

## 2023-08-22 LAB — ALLERGEN PROFILE, PERENNIAL ALLERGEN IGE

## 2023-09-01 DIAGNOSIS — Z23 Encounter for immunization: Secondary | ICD-10-CM | POA: Diagnosis not present

## 2023-09-09 DIAGNOSIS — L649 Androgenic alopecia, unspecified: Secondary | ICD-10-CM | POA: Diagnosis not present

## 2023-09-17 ENCOUNTER — Ambulatory Visit (HOSPITAL_BASED_OUTPATIENT_CLINIC_OR_DEPARTMENT_OTHER)
Admission: RE | Admit: 2023-09-17 | Discharge: 2023-09-17 | Disposition: A | Discharge status: Home / Self Care | Payer: Medicare Other | Source: Ambulatory Visit | Attending: Internal Medicine | Admitting: Internal Medicine

## 2023-09-17 DIAGNOSIS — R590 Localized enlarged lymph nodes: Secondary | ICD-10-CM | POA: Diagnosis not present

## 2023-09-17 DIAGNOSIS — J679 Hypersensitivity pneumonitis due to unspecified organic dust: Secondary | ICD-10-CM | POA: Diagnosis not present

## 2023-09-17 DIAGNOSIS — R918 Other nonspecific abnormal finding of lung field: Secondary | ICD-10-CM | POA: Diagnosis not present

## 2023-09-17 DIAGNOSIS — R062 Wheezing: Secondary | ICD-10-CM | POA: Insufficient documentation

## 2023-09-17 DIAGNOSIS — I700 Atherosclerosis of aorta: Secondary | ICD-10-CM | POA: Diagnosis not present

## 2023-09-17 DIAGNOSIS — I2510 Atherosclerotic heart disease of native coronary artery without angina pectoris: Secondary | ICD-10-CM | POA: Diagnosis not present

## 2023-10-07 ENCOUNTER — Telehealth: Payer: Self-pay | Admitting: Internal Medicine

## 2023-10-07 NOTE — Telephone Encounter (Signed)
Pt calling back to have results sent to pt PCP Dr. Welton Flakes

## 2023-10-07 NOTE — Telephone Encounter (Signed)
Pt calling to have results sent to pt PCP Dr. Welton Flakes

## 2023-10-09 DIAGNOSIS — J849 Interstitial pulmonary disease, unspecified: Secondary | ICD-10-CM | POA: Diagnosis not present

## 2023-10-09 DIAGNOSIS — Z6822 Body mass index (BMI) 22.0-22.9, adult: Secondary | ICD-10-CM | POA: Diagnosis not present

## 2023-10-11 NOTE — Telephone Encounter (Signed)
Results of pt's CT have been electronically sent to pt's PCP.

## 2023-10-13 NOTE — Progress Notes (Unsigned)
@Patient  ID: Sharon Miranda, female    DOB: 03/23/1951, 72 y.o.   MRN: [id number]  No chief complaint on file.   Referring provider: Lise Auer, MD  HPI: 72 year old female, never smoked.  Past medical history significant for hypertension, hypothyroidism, hyperlipidemia, heart murmur.  Patient of Dr. Marchelle Gearing.   10/14/2023 Patient presents today for follow-up.  Seen by Dr. Marchelle Gearing on 08/19/2023 due to wheezing symptoms.  Concern for asthma.  Patient was ordered for pulmonary function testing, high-resolution CAT scan and RAST allergy panel. Presents today to review testing and having PFTs.   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  Recommend repeat imaging in 12 months to assess for temporal changes.  He had small pulmonary nodule measuring 5 mm, nonspecific and felt likely to be benign.  Mild amount of pericardial fluid, unlikely to be hemodynamic significance at this time.  Aortic arthrosclerosis in addition to left anterior descending coronary artery disease.  Allergy testing was normal.  Plan follow-up with cardiology  Allergies  Allergen Reactions   Codeine Sulfate [Codeine] Itching    Immunization History  Administered Date(s) Administered   Influenza-Unspecified 10/09/2018    Past Medical History:  Diagnosis Date   Adult hypothyroidism    Arthralgia of hand    Benign essential HTN    Mixed hyperlipidemia 01/26/2020   Murmur 01/26/2020   Precordial pain 01/26/2020   Recurrent genital herpes     Tobacco History: Social History   Tobacco Use  Smoking Status Never  Smokeless Tobacco Never   Counseling given: Not Answered   Outpatient Medications Prior to Visit  Medication Sig Dispense Refill   amLODipine (NORVASC) 5 MG tablet TAKE 1 TABLET BY MOUTH  DAILY 15 tablet 0   aspirin EC 81 MG tablet Take 81 mg by mouth daily.     atorvastatin (LIPITOR) 20 MG tablet Take 1 tablet (20 mg total) by  mouth daily. 90 tablet 1   fluticasone (FLONASE) 50 MCG/ACT nasal spray Place 2 sprays into both nostrils daily.     hydrochlorothiazide (HYDRODIURIL) 25 MG tablet TAKE 1 TABLET BY MOUTH  DAILY 90 tablet 3   levothyroxine (SYNTHROID) 137 MCG tablet Take 137 mcg by mouth daily.     No facility-administered medications prior to visit.      Review of Systems  Review of Systems   Physical Exam  There were no vitals taken for this visit. Physical Exam   Lab Results:  CBC    Component Value Date/Time   WBC 7.8 08/19/2023 1620   RBC 5.06 08/19/2023 1620   HGB 14.4 08/19/2023 1620   HCT 43.7 08/19/2023 1620   PLT 335.0 08/19/2023 1620   MCV 86.4 08/19/2023 1620   MCHC 32.9 08/19/2023 1620   RDW 13.5 08/19/2023 1620   LYMPHSABS 1.9 08/19/2023 1620   MONOABS 0.7 08/19/2023 1620   EOSABS 0.2 08/19/2023 1620   BASOSABS 0.1 08/19/2023 1620    BMET No results found for: "NA", "K", "CL", "CO2", "GLUCOSE", "BUN", "CREATININE", "CALCIUM", "GFRNONAA", "GFRAA"  BNP No results found for: "BNP"  ProBNP No results found for: "PROBNP"  Imaging: CT Chest High Resolution  Result Date: 09/28/2023 CLINICAL DATA:  72 year old female with history of wheezing. EXAM: CT CHEST WITHOUT CONTRAST TECHNIQUE: Multidetector CT imaging of the chest was performed following the standard protocol without intravenous contrast. High resolution imaging of the lungs, as well as inspiratory and expiratory imaging, was performed. RADIATION DOSE REDUCTION: This exam was performed  according to the departmental dose-optimization program which includes automated exposure control, adjustment of the mA and/or kV according to patient size and/or use of iterative reconstruction technique. COMPARISON:  No priors. FINDINGS: Cardiovascular: Heart size is normal. Small amount of pericardial fluid and/or thickening, unlikely to be of any hemodynamic significance at this time. No pericardial calcification. There is aortic  atherosclerosis, as well as atherosclerosis of the great vessels of the mediastinum and the coronary arteries, including calcified atherosclerotic plaque in the left anterior descending coronary artery. Mediastinum/Nodes: No pathologically enlarged mediastinal or hilar lymph nodes. Please note that accurate exclusion of hilar adenopathy is limited on noncontrast CT scans. Esophagus is unremarkable in appearance. No axillary lymphadenopathy. Lungs/Pleura: High-resolution images demonstrate widespread but patchy areas of ground-glass attenuation, mild septal thickening, thickening of the peribronchovascular interstitium, cylindrical bronchiectasis and peripheral bronchiolectasis scattered throughout the lungs bilaterally with no discernible craniocaudal gradient. Relative sparing of the extreme lung bases is noted, particularly in the periphery of the lungs. No frank honeycombing. Inspiratory and expiratory imaging demonstrates moderate air trapping indicative of small airways disease. No acute consolidative airspace disease. No pleural effusions. A few tiny 2-3 mm pulmonary nodules appear to reflect areas of mucoid impaction. There is also a 5 mm subpleural nodule in the posterior aspect of the left lower lobe (axial image 85 of series 5), nonspecific, but potentially a subpleural lymph node. No other larger more suspicious appearing pulmonary nodules or masses are noted. Upper Abdomen: Aortic atherosclerosis. Numerous calcified granulomas are noted in the liver. Musculoskeletal: There are no aggressive appearing lytic or blastic lesions noted in the visualized portions of the skeleton. IMPRESSION: 1. The appearance of the lungs does suggest interstitial lung disease, with a spectrum of findings considered most compatible with an alternative diagnosis (not usual interstitial pneumonia) per current ATS guidelines. 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 Repeat high-resolution  chest CT is recommended in 12 months to assess for temporal changes in the appearance of the lung parenchyma. 2. Small pulmonary nodules measuring 5 mm or less in size, nonspecific, but statistically likely benign. Attention at time of repeat high-resolution chest CT is recommended to ensure stability or regression of these findings. 3. Small amount of pericardial fluid and/or thickening, unlikely to be of hemodynamic significance at this time. No pericardial calcification at this time. 4. Aortic atherosclerosis, in addition to left anterior descending coronary artery disease. Assessment for potential risk factor modification, dietary therapy or pharmacologic therapy may be warranted, if clinically indicated. Aortic Atherosclerosis (ICD10-I70.0). Electronically Signed   By: Trudie Reed M.D.   On: 09/28/2023 13:11     Assessment & Plan:   No problem-specific Assessment & Plan notes found for this encounter.     Glenford Bayley, NP 10/13/2023

## 2023-10-14 ENCOUNTER — Encounter: Payer: Self-pay | Admitting: Primary Care

## 2023-10-14 ENCOUNTER — Telehealth: Payer: Self-pay | Admitting: Primary Care

## 2023-10-14 ENCOUNTER — Ambulatory Visit: Payer: Medicare Other | Admitting: Primary Care

## 2023-10-14 VITALS — BP 128/78 | HR 85 | Ht 61.0 in | Wt 126.0 lb

## 2023-10-14 DIAGNOSIS — J849 Interstitial pulmonary disease, unspecified: Secondary | ICD-10-CM | POA: Diagnosis not present

## 2023-10-14 LAB — NITRIC OXIDE: Nitric Oxide: 24

## 2023-10-14 NOTE — Patient Instructions (Addendum)
HRCT imaging suggestive of interstitial lung disease, most compatible with alternative diagnosis (not UIP) . Recommend repeat imaging in 12 months to assess for temporal changes. Small pulmonary nodule measuring 5 mm, nonspecific and felt likely to be benign. Follow-up on repeat imaging. Allergy testing was normal.   FENO was normal, we are working on getting PFT results  Use Nebulizer machine every 6 hours if acutely sick with URI or bronchitis.    Notify office if respiratory symptoms worsen in any way   Please follow-up with PCP/cardiology regarding pericardial thickening and coronary artery disease   Orders: FENO re: wheeze (done) HRCT in 1 year to assess for temporal changes (ordered) ILD labs today (ordered)  Follow-up: 6 months with Dr. Marchelle Gearing   Pulmonary Fibrosis  Pulmonary fibrosis is a type of lung disease that causes scarring. Over time, the scar tissue builds up in the air sacs of your lungs (alveoli). This makes it hard for you to breathe because less oxygen gets into your bloodstream. Scarring from pulmonary fibrosis is permanent and may lead to other serious health problems. What are the causes? There are many different causes of pulmonary fibrosis. In some cases, the cause is not known. This is called idiopathic pulmonary fibrosis. Other causes include: Exposure to chemicals and substances found in agricultural, farm, Holiday representative, or factory work. These include mold, asbestos, silica, metal dusts, and toxic fumes. Sarcoidosis. In this disease, areas of inflammatory cells (granulomas) form and most often affect the lungs. Autoimmune diseases. These include diseases such as rheumatoid arthritis, systemic sclerosis, or connective tissue disease. Taking certain medicines. These include drugs used in radiation therapy or used to treat seizures, heart problems, and some infections. What increases the risk? You are more likely to develop this condition if: You have a  family history of the disease. You are an older person. The condition is more common in older adults. You have a history of smoking. You have a job that exposes you to certain chemicals. You have gastroesophageal reflux disease (GERD). What are the signs or symptoms? Symptoms of this condition include: Difficulty breathing that gets worse with activity. Shortness of breath (dyspnea). Dry, hacking cough. Rapid, shallow breathing during exercise or while at rest. Other symptoms may include: Loss of appetite or weight loss Tiredness (fatigue) or weakness. Bluish skin and lips. Rounded and enlarged fingertips (clubbing). How is this diagnosed? This condition may be diagnosed based on: Your symptoms and medical history. A physical exam. You may also have tests, including: A test that involves looking inside your lungs with an instrument (bronchoscopy). Imaging studies of your lungs and heart. Tests to measure how well you are breathing (pulmonary function tests). Blood tests. Tests to see how well your lungs work while you are walking (pulmonary stress test). A procedure to remove a lung tissue sample to look at it under a microscope (biopsy). How is this treated? There is no cure for pulmonary fibrosis. Treatment focuses on managing symptoms and preventing scarring from getting worse. This may include: Medicines, such as: Steroids to prevent permanent lung changes. Medicines to suppress your body's defense system (immune system). Medicines to help with lung function by reducing inflammation or scarring. Ongoing monitoring with X-rays and lab work. Oxygen therapy. Pulmonary rehabilitation. Surgery. In some cases, a lung transplant is possible. Follow these instructions at home:  Medicines Take over-the-counter and prescription medicines only as told by your health care provider. Keep your vaccinations up to date as recommended by your health care provider. Activity Get  regular  exercise, but do not pick activities that are too strenuous for you. Ask your health care provider what activities are safe for you. If you have physical limitations, you may get exercise by walking, using a stationary bike, or doing chair exercises. Ask your health care provider about using oxygen while exercising. Do breathing exercises as told by your health care provider. Plan rest periods when you get tired. General instructions Do not use any products that contain nicotine or tobacco. These products include cigarettes, chewing tobacco, and vaping devices, such as e-cigarettes. If you need help quitting, ask your health care provider. If you are exposed to chemicals and substances at work, make sure that you wear a mask or respirator at all times. Learn to manage stress. If you need help to do this, ask your health care provider. Join a pulmonary rehabilitation program or a support group for people with pulmonary fibrosis. Eat small meals often so you do not get too full. Overeating can make breathing trouble worse. Maintain a healthy weight. Lose weight if you need to. Keep all follow-up visits. This is important. Where to find more information American Lung Association: www.lung.org National Heart, Lung, and Blood Institute: PopSteam.is Pulmonary Fibrosis Foundation: pulmonaryfibrosis.org Contact a health care provider if: You have symptoms that do not get better with medicines. You are not able to be as active as usual. You have trouble taking a deep breath. You have a fever or chills. You have blue lips or skin. You have a lot of headaches. You cough up mucus that is dark in color. You have feelings of depression or sadness. You are unable to sleep because it is hard to breathe. Get help right away if: Your symptoms suddenly worsen. You have chest pain. You cough up blood. You get very confused or sleepy. These symptoms may be an emergency. Get help right away. Call  911. Do not wait to see if the symptoms will go away. Do not drive yourself to the hospital. Summary Pulmonary fibrosis is a type of lung disease that causes scar tissue to build up in the air sacs of your lungs (alveoli) over time. This makes it hard for you to breathe because less oxygen gets into your bloodstream. Scarring from pulmonary fibrosis is permanent and may lead to other serious health problems. You are more likely to develop this condition if you have a family history of the condition or a job that exposes you to certain chemicals. There is no cure for pulmonary fibrosis. Treatment focuses on managing symptoms and preventing scarring from getting worse. This information is not intended to replace advice given to you by your health care provider. Make sure you discuss any questions you have with your health care provider. Document Revised: 07/17/2021 Document Reviewed: 07/17/2021 Elsevier Patient Education  2024 [employer].

## 2023-10-14 NOTE — Telephone Encounter (Signed)
Did you get a chance to look at [name] chest imaging. She is asymptomatic. No respiratory symptoms. Has wheezing on exam. HRCT on 09/17/23 showed widespread but patchy areas of ground glass attenuation, mild septal thickening, cylindrical bronchiectasis. No discernable craniocaudal gradient. No frank honeycombing. Findings suggestive of alternative diagnosis, not UIP   -Ordering ILD panel  -Needs PFTs scheduled  -Plan repeat CT scan in one year to monitor for progression of ILD. -FU in 6 months

## 2023-10-15 ENCOUNTER — Telehealth: Payer: Self-pay | Admitting: Primary Care

## 2023-10-15 DIAGNOSIS — J849 Interstitial pulmonary disease, unspecified: Secondary | ICD-10-CM

## 2023-10-15 NOTE — Telephone Encounter (Signed)
Patient would like Lab Work order to be sent to [facility] in [place] (Dr.Khan's office)so that she can get her blood work done there since she lives in [place] and out phlebotomist tech is out this week. Please contact patient if this can be done [phone]  Address: [address] [place]

## 2023-10-19 NOTE — Telephone Encounter (Signed)
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 If you see my note I went down asthma pathway but got CT which shows GGO the alternative pattern, This is all smelling like HP and the level of ground glass suggestes reversibility before it become fibrotic. So6 months too far out  Plan  - she needs ESR, ANA, RF, CCP, ssa, ssb, scl-70 - she needs quantiferon gold - she needs HP panel = she needs PFT  - she needs to see me back next 4-8 weeks -> bsed on results course of steroids or biopsy

## 2023-10-20 NOTE — Telephone Encounter (Signed)
Yes I ordered labs and PFTs need to be scheduled I did not get quat gold  Amy can we get patient on MR schedule in 4-8 weeks

## 2023-10-24 NOTE — Telephone Encounter (Signed)
Called and spoke with pt, lab orders have been faxed to [facility] in [place]. Nfn

## 2023-10-27 DIAGNOSIS — J849 Interstitial pulmonary disease, unspecified: Secondary | ICD-10-CM | POA: Diagnosis not present

## 2023-11-04 ENCOUNTER — Telehealth: Payer: Self-pay | Admitting: Primary Care

## 2023-11-04 NOTE — Telephone Encounter (Signed)
Patient went to get blood work done and they drew 8 tubes of blood and she was just wondering why they drew so much blood and if we received it. She can be reached at [phone] or [phone]

## 2023-11-05 NOTE — Telephone Encounter (Signed)
Spoke with patient and she had labs drawn at [facility]. She just wasn't sure why so many tubes were needed. Explained that some tests require specific tubes and some tests cannot be run with other tests. Patient voiced understanding. Advised her we will contact her once lab results are received and reviewed by provider. Nothing further needed at this time.

## 2023-12-05 NOTE — Telephone Encounter (Signed)
Sharon Miranda,  Not sure what was orederd v not ordered from my recommendatin a months ago but I am copying front desk as well. BEcause right now I  see only a PFT appt. Ok for me to see her in [place]- feb 2025

## 2023-12-09 DIAGNOSIS — M25522 Pain in left elbow: Secondary | ICD-10-CM | POA: Diagnosis not present

## 2023-12-09 NOTE — Addendum Note (Signed)
 Addended by: Glenford Bayley on: 12/09/2023 02:00 PM   Modules accepted: Orders

## 2023-12-09 NOTE — Telephone Encounter (Signed)
Labs have been ordered but not completed, I will add quant gold. Please make sure patient comes in for all her lab work before her visit in February

## 2023-12-11 ENCOUNTER — Telehealth: Payer: Self-pay | Admitting: Primary Care

## 2023-12-11 NOTE — Telephone Encounter (Signed)
 Patient would like to have labwork done at [facility]. Patient phone number is [phone].

## 2023-12-11 NOTE — Telephone Encounter (Signed)
 Pt is aware of below message/recommendations and voiced her understanding.  She will have labs when she comes in on 1/23 for PFT. Nothing further needed.

## 2023-12-18 NOTE — Telephone Encounter (Signed)
 Patient will have labwork done after PFT on 01/01/24. NFN

## 2023-12-23 DIAGNOSIS — L638 Other alopecia areata: Secondary | ICD-10-CM | POA: Diagnosis not present

## 2024-01-23 ENCOUNTER — Ambulatory Visit: Payer: Medicare Other | Admitting: Internal Medicine

## 2024-01-23 ENCOUNTER — Other Ambulatory Visit: Payer: Medicare Other

## 2024-01-23 DIAGNOSIS — R062 Wheezing: Secondary | ICD-10-CM

## 2024-01-23 LAB — PULMONARY FUNCTION TEST
DL/VA % pred: 131 %
DL/VA: 5.51 ml/min/mmHg/L
DLCO cor % pred: 105 %
DLCO cor: 18.21 ml/min/mmHg
DLCO unc % pred: 105 %
DLCO unc: 18.21 ml/min/mmHg
FEF 25-75 Post: 1.86 L/s
FEF 25-75 Pre: 1.9 L/s
FEF2575-%Change-Post: -2 %
FEF2575-%Pred-Post: 120 %
FEF2575-%Pred-Pre: 123 %
FEV1-%Change-Post: 3 %
FEV1-%Pred-Post: 91 %
FEV1-%Pred-Pre: 88 %
FEV1-Post: 1.71 L
FEV1-Pre: 1.65 L
FEV1FVC-%Change-Post: 0 %
FEV1FVC-%Pred-Pre: 112 %
FEV6-%Change-Post: 4 %
FEV6-%Pred-Post: 86 %
FEV6-%Pred-Pre: 82 %
FEV6-Post: 2.05 L
FEV6-Pre: 1.96 L
FEV6FVC-%Pred-Post: 105 %
FEV6FVC-%Pred-Pre: 105 %
FVC-%Change-Post: 4 %
FVC-%Pred-Post: 82 %
FVC-%Pred-Pre: 78 %
FVC-Post: 2.05 L
FVC-Pre: 1.96 L
Post FEV1/FVC ratio: 83 %
Post FEV6/FVC ratio: 100 %
Pre FEV1/FVC ratio: 84 %
Pre FEV6/FVC Ratio: 100 %
RV % pred: 108 %
RV: 2.3 L
TLC % pred: 98 %
TLC: 4.55 L

## 2024-01-23 NOTE — Patient Instructions (Signed)
Full PFT performed today.

## 2024-01-23 NOTE — Progress Notes (Signed)
Full PFT performed today.

## 2024-02-03 DIAGNOSIS — L638 Other alopecia areata: Secondary | ICD-10-CM | POA: Diagnosis not present

## 2024-02-04 ENCOUNTER — Ambulatory Visit: Payer: Medicare Other | Admitting: Internal Medicine

## 2024-02-04 ENCOUNTER — Telehealth: Payer: Self-pay | Admitting: Internal Medicine

## 2024-02-04 ENCOUNTER — Encounter: Payer: Self-pay | Admitting: Internal Medicine

## 2024-02-04 VITALS — BP 124/68 | HR 83 | Ht 61.0 in | Wt 123.0 lb

## 2024-02-04 DIAGNOSIS — J849 Interstitial pulmonary disease, unspecified: Secondary | ICD-10-CM

## 2024-02-04 DIAGNOSIS — R062 Wheezing: Secondary | ICD-10-CM | POA: Diagnosis not present

## 2024-02-04 DIAGNOSIS — R918 Other nonspecific abnormal finding of lung field: Secondary | ICD-10-CM | POA: Diagnosis not present

## 2024-02-04 HISTORY — DX: Interstitial pulmonary disease, unspecified: J84.9

## 2024-02-04 NOTE — Progress Notes (Signed)
 OV 08/19/2023  Subjective:  Patient ID: Sharon Miranda, female , DOB: [birth year] , age 73 y.o. , MRN: [id number] , ADDRESS: [address] [place] [address] PCP Lise Auer, MD Patient Care Team: Lise Auer, MD as PCP - General (Family Medicine) Thomasene Ripple, DO as PCP - Cardiology (Cardiology)  This Provider for this visit: Treatment Team:  Attending Provider: Kalman Shan, MD    08/19/2023 -   Chief Complaint  Patient presents with   Consult    Consult for possible ILD, rattle in chest     HPI [facility] 73 y.o. -referred for a new consult because primary care physician heard a rattle in the chest.  She said this was recently a few months ago but she says she is completely asymptomatic.  She says she never gets sick she gets a flu shot and vaccines and never reacts to it.  There is no hospitalizations no ER visits no urgent care visits no surgeries.  She walks half mile per day.  Therefore she is surprised.  On exam she actually has a wheeze.  She did have a chest x-ray in the outside health system in 2023.  I am unable to pull the image but it shows she has potential chronic bronchitis.  She has never had COVID  Nevertheless she was sent in ILD questionnaire and from a exposure standpoint  Symptoms: No shortness of breath no coughing no wheezing no nausea vomiting or diarrhea  Family history of lung disease: Denies  Exposure history: No smoking no marijuana no cocaine no intravenous drug use  Home environment history: Lives in a 75 year old home by herself.  She says she is divorced.  No kids.  No organic exposures in the house.  She used to work in [employer] and then she is retired.  Occupational exposures extensive organic and inorganic antigen exposure history is negative  Medication history: Negative for any drugs that cause ILD or eosinophilic pneumonias.-   FENO 08/19/23  - could NOT do \     OV 10/13/13 wth APP  10/14/2023 Patient presents  today for follow-up.  Seen by Dr. Marchelle Gearing on 08/19/2023 due to wheezing symptoms.  Referred by Dr. Welton Flakes. Concern for asthma.  Patient was ordered for pulmonary function testing, high-resolution CAT scan and RAST allergy panel. Presents today to review testing and having PFTs.   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  Recommend repeat imaging in 12 months to assess for temporal changes.  He had small pulmonary nodule measuring 5 mm, nonspecific and felt likely to be benign.  Mild amount of pericardial fluid, unlikely to be hemodynamic significance at this time.  Aortic arthrosclerosis in addition to left anterior descending coronary artery disease. Her allergy testing was normal. She tells me she had PFTs at [facility]   No problem breathing  No wheezing  Occasional cough, more throat clearing  History of Present Illness   The patient, with a history of hypertension, hypothyroidism, hyperlipidemia, and a heart murmur, was referred for a consultation due to wheezing symptoms noted by her primary care physician. The patient denies any breathing difficulties and maintains an active lifestyle, including yard work. She reports occasional cough, which she describes more as throat clearing, and denies any symptoms of reflux or GERD. She only uses nebulizer during respiratory infections but does not regularly require.  The patient's recent high-resolution CT scan on 09/17/23 suggested interstitial lung disease, most compatible with alternative  diagnosis not UIP.  CT scan also revealed a small pulmonary nodule and some fluid or thickening around the heart. The patient has a history of coronary artery disease and a previous heart attack, which was discovered incidentally during a visit for a broken leg.    OV 02/04/2024  Subjective:  Patient ID: Sharon Miranda, female , DOB: 05-Jun-1951 , age 73 y.o. , MRN: [id number] , ADDRESS: [address] [place] [address] PCP Lise Auer, MD Patient Care Team: Lise Auer, MD as PCP - General (Family Medicine) Thomasene Ripple, DO as PCP - Cardiology (Cardiology)  This Provider for this visit: Treatment Team:  Attending Provider: Kalman Shan, MD    02/04/2024 -   Chief Complaint  Patient presents with   Follow-up    Breathing is doing great. She has occ dry cough.      HPI [facility] 73 y.o. -returns for follow-up.  Today I learned that she is also a friend of Sol Blazing who is a mutual friend.  She tells me she continues to be asymptomatic.  She did see Buelah Manis.  Autoimmune profile was done but she did this at the primary care office and I do not have the results.  Apparently the results were faxed.  I have contacted our triage desk to get this.  However on exam today she does have squeaking wheezing that is scattered.  Pulmonary function test is normal.  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 She admitted to longstanding usage of down pillow and down comforter.   SYMPTOM SCALE - ILD 02/04/2024  Current weight   O2 use ra  Shortness of Breath 0 -> 5 scale with 5 being worst (score 6 If unable to do)  At rest 0  Simple tasks - showers, clothes change, eating, shaving 0  Household (dishes, doing bed, laundry) 0  Shopping 0  Walking level at own pace 0  Walking up Stairs 0  Total (30-36) Dyspnea Score 0  How bad is your cough? Very seldom  How bad is your fatigue 0  How bad is nausea 0  How bad is vomiting?  0  How bad is diarrhea? 0  How bad is anxiety? 0  How bad is depression 0  Any chronic pain - if so where and how bad 0      PFT     Latest Ref Rng & Units 01/23/2024    9:36 AM  PFT Results  FVC-Pre L 1.96   FVC-Predicted Pre % 78   FVC-Post L 2.05   FVC-Predicted Post % 82   Pre FEV1/FVC % % 84   Post FEV1/FCV % % 83    FEV1-Pre L 1.65   FEV1-Predicted Pre % 88   FEV1-Post L 1.71   DLCO uncorrected ml/min/mmHg 18.21   DLCO UNC% % 105   DLCO corrected ml/min/mmHg 18.21   DLCO COR %Predicted % 105   DLVA Predicted % 131   TLC L 4.55   TLC % Predicted % 98   RV % Predicted % 108        LAB RESULTS last 96 hours No results found.       has a past medical history of Adult hypothyroidism, Arthralgia of hand, Benign essential HTN, Mixed hyperlipidemia (01/26/2020), Murmur (01/26/2020), Precordial pain (01/26/2020), and Recurrent genital herpes.   reports that she has never smoked. She has never used smokeless tobacco.  Past Surgical History:  Procedure Laterality Date   ABDOMINAL HYSTERECTOMY     due to endometriosis   APPENDECTOMY  1996   CHOLECYSTECTOMY  2008   TONSILLECTOMY AND ADENOIDECTOMY      Allergies  Allergen Reactions   Codeine Sulfate [Codeine] Itching    Immunization History  Administered Date(s) Administered   Influenza, Quadrivalent, Recombinant, Inj, Pf 08/20/2021, 08/21/2023   Influenza-Unspecified 10/09/2018    Family History  Problem Relation Age of Onset   Heart attack Mother    Heart attack Brother    COPD Brother    Heart attack Brother    Addison's disease Brother    Leukemia Brother      Current Outpatient Medications:    amLODipine (NORVASC) 5 MG tablet, TAKE 1 TABLET BY MOUTH  DAILY, Disp: 15 tablet, Rfl: 0   aspirin EC 81 MG tablet, Take 81 mg by mouth daily., Disp: , Rfl:    atorvastatin (LIPITOR) 20 MG tablet, Take 1 tablet (20 mg total) by mouth daily., Disp: 90 tablet, Rfl: 1   fluticasone (FLONASE) 50 MCG/ACT nasal spray, Place 2 sprays into both nostrils daily., Disp: , Rfl:    hydrochlorothiazide (HYDRODIURIL) 25 MG tablet, TAKE 1 TABLET BY MOUTH  DAILY, Disp: 90 tablet, Rfl: 3   levothyroxine (SYNTHROID) 137 MCG tablet, Take 137 mcg by mouth daily., Disp: , Rfl:       Objective:   Vitals:   02/04/24 1250 02/04/24 1252  BP: 124/68    Pulse: 83   SpO2: 100%   Weight:  123 lb (55.8 kg)  Height:  5\' 1"  (1.549 m)    Estimated body mass index is 23.24 kg/m as calculated from the following:   Height as of this encounter: 5\' 1"  (1.549 m).   Weight as of this encounter: 123 lb (55.8 kg).  @WEIGHTCHANGE @  [employer]   02/04/24 1252  Weight: 123 lb (55.8 kg)     Physical Exam   General: No distress. Looks well O2 at rest: no Cane present: no Sitting in wheel chair: no Frail: no Obese: no Neuro: Alert and Oriented x 3. GCS 15. Speech normal Psych: Pleasant Resp:  Barrel Chest - no.  Wheeze - YES and SQUEAKS, Crackles - no, No overt respiratory distress CVS: Normal heart sounds. Murmurs - no Ext: Stigmata of Connective Tissue Disease - no HEENT: Normal upper airway. PEERL +. No post nasal drip        Assessment:       ICD-10-CM   1. ILD (interstitial lung disease) (HCC)  J84.9     2. Wheezing  R06.2     3. Lung field abnormal finding on examination  R91.8          Plan:     Patient Instructions     ICD-10-CM   1. ILD (interstitial lung disease) (HCC)  J84.9     2. Wheezing  R06.2     3. Lung field abnormal finding on examination  R91.8       Although you feel asymptomatic and the pulmonary function test is normal the CT scan shows some groundglass opacities send you do have some squeaking/wheezing on exam.  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 -Will arrange for bronchoscopy with lavage and transbronchial biopsies in the next few to several weeks -  Spirometry and DLCO in 12 weeks  Follow-up - 12 weeks 15-minute visit but after breathing test.   FOLLOWUP Return in about 12 weeks (around 04/28/2024) for 15 min visit, after Cleda Daub and DLCO, with Dr Marchelle Gearing, Face to Face Visit.    SIGNATURE    Dr.  Kalman Shan, M.D., F.C.C.P,  Pulmonary and Critical Care Medicine Staff Physician, [facility] Center Director - Interstitial Lung Disease  Program  Pulmonary Fibrosis [facility] at [facility] [place], [place], [address]  Pager: [phone], If no answer or between  15:00h - 7:00h: call [phone] Telephone: [phone]  1:26 PM 02/04/2024

## 2024-02-04 NOTE — H&P (View-Only) (Signed)
 OV 08/19/2023  Subjective:  Patient ID: Sharon Miranda, female , DOB: [birth year] , age 73 y.o. , MRN: [id number] , ADDRESS: [address] [place] [address] PCP Lise Auer, MD Patient Care Team: Lise Auer, MD as PCP - General (Family Medicine) Thomasene Ripple, DO as PCP - Cardiology (Cardiology)  This Provider for this visit: Treatment Team:  Attending Provider: Kalman Shan, MD    08/19/2023 -   Chief Complaint  Patient presents with   Consult    Consult for possible ILD, rattle in chest     HPI [facility] 73 y.o. -referred for a new consult because primary care physician heard a rattle in the chest.  She said this was recently a few months ago but she says she is completely asymptomatic.  She says she never gets sick she gets a flu shot and vaccines and never reacts to it.  There is no hospitalizations no ER visits no urgent care visits no surgeries.  She walks half mile per day.  Therefore she is surprised.  On exam she actually has a wheeze.  She did have a chest x-ray in the outside health system in 2023.  I am unable to pull the image but it shows she has potential chronic bronchitis.  She has never had COVID  Nevertheless she was sent in ILD questionnaire and from a exposure standpoint  Symptoms: No shortness of breath no coughing no wheezing no nausea vomiting or diarrhea  Family history of lung disease: Denies  Exposure history: No smoking no marijuana no cocaine no intravenous drug use  Home environment history: Lives in a 75 year old home by herself.  She says she is divorced.  No kids.  No organic exposures in the house.  She used to work in [employer] and then she is retired.  Occupational exposures extensive organic and inorganic antigen exposure history is negative  Medication history: Negative for any drugs that cause ILD or eosinophilic pneumonias.-   FENO 08/19/23  - could NOT do \     OV 10/13/13 wth APP  10/14/2023 Patient presents  today for follow-up.  Seen by Dr. Marchelle Gearing on 08/19/2023 due to wheezing symptoms.  Referred by Dr. Welton Flakes. Concern for asthma.  Patient was ordered for pulmonary function testing, high-resolution CAT scan and RAST allergy panel. Presents today to review testing and having PFTs.   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  Recommend repeat imaging in 12 months to assess for temporal changes.  He had small pulmonary nodule measuring 5 mm, nonspecific and felt likely to be benign.  Mild amount of pericardial fluid, unlikely to be hemodynamic significance at this time.  Aortic arthrosclerosis in addition to left anterior descending coronary artery disease. Her allergy testing was normal. She tells me she had PFTs at [facility]   No problem breathing  No wheezing  Occasional cough, more throat clearing  History of Present Illness   The patient, with a history of hypertension, hypothyroidism, hyperlipidemia, and a heart murmur, was referred for a consultation due to wheezing symptoms noted by her primary care physician. The patient denies any breathing difficulties and maintains an active lifestyle, including yard work. She reports occasional cough, which she describes more as throat clearing, and denies any symptoms of reflux or GERD. She only uses nebulizer during respiratory infections but does not regularly require.  The patient's recent high-resolution CT scan on 09/17/23 suggested interstitial lung disease, most compatible with alternative  diagnosis not UIP.  CT scan also revealed a small pulmonary nodule and some fluid or thickening around the heart. The patient has a history of coronary artery disease and a previous heart attack, which was discovered incidentally during a visit for a broken leg.    OV 02/04/2024  Subjective:  Patient ID: Sharon Miranda, female , DOB: 05-Jun-1951 , age 73 y.o. , MRN: [id number] , ADDRESS: [address] [place] [address] PCP Lise Auer, MD Patient Care Team: Lise Auer, MD as PCP - General (Family Medicine) Thomasene Ripple, DO as PCP - Cardiology (Cardiology)  This Provider for this visit: Treatment Team:  Attending Provider: Kalman Shan, MD    02/04/2024 -   Chief Complaint  Patient presents with   Follow-up    Breathing is doing great. She has occ dry cough.      HPI [facility] 73 y.o. -returns for follow-up.  Today I learned that she is also a friend of Sol Blazing who is a mutual friend.  She tells me she continues to be asymptomatic.  She did see Buelah Manis.  Autoimmune profile was done but she did this at the primary care office and I do not have the results.  Apparently the results were faxed.  I have contacted our triage desk to get this.  However on exam today she does have squeaking wheezing that is scattered.  Pulmonary function test is normal.  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 She admitted to longstanding usage of down pillow and down comforter.   SYMPTOM SCALE - ILD 02/04/2024  Current weight   O2 use ra  Shortness of Breath 0 -> 5 scale with 5 being worst (score 6 If unable to do)  At rest 0  Simple tasks - showers, clothes change, eating, shaving 0  Household (dishes, doing bed, laundry) 0  Shopping 0  Walking level at own pace 0  Walking up Stairs 0  Total (30-36) Dyspnea Score 0  How bad is your cough? Very seldom  How bad is your fatigue 0  How bad is nausea 0  How bad is vomiting?  0  How bad is diarrhea? 0  How bad is anxiety? 0  How bad is depression 0  Any chronic pain - if so where and how bad 0      PFT     Latest Ref Rng & Units 01/23/2024    9:36 AM  PFT Results  FVC-Pre L 1.96   FVC-Predicted Pre % 78   FVC-Post L 2.05   FVC-Predicted Post % 82   Pre FEV1/FVC % % 84   Post FEV1/FCV % % 83    FEV1-Pre L 1.65   FEV1-Predicted Pre % 88   FEV1-Post L 1.71   DLCO uncorrected ml/min/mmHg 18.21   DLCO UNC% % 105   DLCO corrected ml/min/mmHg 18.21   DLCO COR %Predicted % 105   DLVA Predicted % 131   TLC L 4.55   TLC % Predicted % 98   RV % Predicted % 108        LAB RESULTS last 96 hours No results found.       has a past medical history of Adult hypothyroidism, Arthralgia of hand, Benign essential HTN, Mixed hyperlipidemia (01/26/2020), Murmur (01/26/2020), Precordial pain (01/26/2020), and Recurrent genital herpes.   reports that she has never smoked. She has never used smokeless tobacco.  Past Surgical History:  Procedure Laterality Date   ABDOMINAL HYSTERECTOMY     due to endometriosis   APPENDECTOMY  1996   CHOLECYSTECTOMY  2008   TONSILLECTOMY AND ADENOIDECTOMY      Allergies  Allergen Reactions   Codeine Sulfate [Codeine] Itching    Immunization History  Administered Date(s) Administered   Influenza, Quadrivalent, Recombinant, Inj, Pf 08/20/2021, 08/21/2023   Influenza-Unspecified 10/09/2018    Family History  Problem Relation Age of Onset   Heart attack Mother    Heart attack Brother    COPD Brother    Heart attack Brother    Addison's disease Brother    Leukemia Brother      Current Outpatient Medications:    amLODipine (NORVASC) 5 MG tablet, TAKE 1 TABLET BY MOUTH  DAILY, Disp: 15 tablet, Rfl: 0   aspirin EC 81 MG tablet, Take 81 mg by mouth daily., Disp: , Rfl:    atorvastatin (LIPITOR) 20 MG tablet, Take 1 tablet (20 mg total) by mouth daily., Disp: 90 tablet, Rfl: 1   fluticasone (FLONASE) 50 MCG/ACT nasal spray, Place 2 sprays into both nostrils daily., Disp: , Rfl:    hydrochlorothiazide (HYDRODIURIL) 25 MG tablet, TAKE 1 TABLET BY MOUTH  DAILY, Disp: 90 tablet, Rfl: 3   levothyroxine (SYNTHROID) 137 MCG tablet, Take 137 mcg by mouth daily., Disp: , Rfl:       Objective:   Vitals:   02/04/24 1250 02/04/24 1252  BP: 124/68    Pulse: 83   SpO2: 100%   Weight:  123 lb (55.8 kg)  Height:  5\' 1"  (1.549 m)    Estimated body mass index is 23.24 kg/m as calculated from the following:   Height as of this encounter: 5\' 1"  (1.549 m).   Weight as of this encounter: 123 lb (55.8 kg).  @WEIGHTCHANGE @  [employer]   02/04/24 1252  Weight: 123 lb (55.8 kg)     Physical Exam   General: No distress. Looks well O2 at rest: no Cane present: no Sitting in wheel chair: no Frail: no Obese: no Neuro: Alert and Oriented x 3. GCS 15. Speech normal Psych: Pleasant Resp:  Barrel Chest - no.  Wheeze - YES and SQUEAKS, Crackles - no, No overt respiratory distress CVS: Normal heart sounds. Murmurs - no Ext: Stigmata of Connective Tissue Disease - no HEENT: Normal upper airway. PEERL +. No post nasal drip        Assessment:       ICD-10-CM   1. ILD (interstitial lung disease) (HCC)  J84.9     2. Wheezing  R06.2     3. Lung field abnormal finding on examination  R91.8          Plan:     Patient Instructions     ICD-10-CM   1. ILD (interstitial lung disease) (HCC)  J84.9     2. Wheezing  R06.2     3. Lung field abnormal finding on examination  R91.8       Although you feel asymptomatic and the pulmonary function test is normal the CT scan shows some groundglass opacities send you do have some squeaking/wheezing on exam.  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 -Will arrange for bronchoscopy with lavage and transbronchial biopsies in the next few to several weeks -  Spirometry and DLCO in 12 weeks  Follow-up - 12 weeks 15-minute visit but after breathing test.   FOLLOWUP Return in about 12 weeks (around 04/28/2024) for 15 min visit, after Cleda Daub and DLCO, with Dr Marchelle Gearing, Face to Face Visit.    SIGNATURE    Dr.  Kalman Shan, M.D., F.C.C.P,  Pulmonary and Critical Care Medicine Staff Physician, [facility] Center Director - Interstitial Lung Disease  Program  Pulmonary Fibrosis [facility] at [facility] [place], [place], [address]  Pager: [phone], If no answer or between  15:00h - 7:00h: call [phone] Telephone: [phone]  1:26 PM 02/04/2024

## 2024-02-04 NOTE — Patient Instructions (Addendum)
 ICD-10-CM   1. ILD (interstitial lung disease) (HCC)  J84.9     2. Wheezing  R06.2     3. Lung field abnormal finding on examination  R91.8       Although you feel asymptomatic and the pulmonary function test is normal the CT scan shows some groundglass opacities send you do have some squeaking/wheezing on exam.  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 -Will arrange for bronchoscopy with lavage and transbronchial biopsies in the next few to several weeks -Spirometry and DLCO in 12 weeks  Follow-up - 12 weeks 15-minute visit but after breathing test.

## 2024-02-04 NOTE — Telephone Encounter (Signed)
 Triage  [facility] had extensive autoimmune profile done at [facility]  She did not want to do it here.  Apparently they took 8 vials of blood.  Apparently they faxed the results to [place] but I do not have it.  Please get this from them.

## 2024-02-04 NOTE — Telephone Encounter (Signed)
 PATIENT: Sharon Miranda GENDER: female MRN: [id number] DOB: 04/30/1951 ADDRESS: [address] [place] [address]    Please schedule the following:  Diagnosis: ILD Procedure: Video bronchocoopy, flexible bronchoscopy with BAL +/Transbronchial biopsy  Anesthesia: GENERAL Do you need Fluro? YES Size of Scope: regular Pre-med nebulized lidocaine: NO Priority: Friday 03/05/24 at [facility] Does patient have OSA? no DM? no Or Latex allergy? no Medication Restriction: Stop aspirin 3 days before procedure Anticoagulate/Antiplatelet: x Pre-op Labs Ordered: CBC, CMP, PT/INR, PTT Imaging request: Postop chest x-ray     MISCELLANEOUS KEY INSTRUCTIONS   NO NEED FOR COVID TESTIN  Please let Dr Marchelle Gearing know via reply phone message on Epic  Thank you     Key patient medical info     Allergy History:  Allergies  Allergen Reactions   Codeine Sulfate [Codeine] Itching     Current Outpatient Medications:    amLODipine (NORVASC) 5 MG tablet, TAKE 1 TABLET BY MOUTH  DAILY, Disp: 15 tablet, Rfl: 0   aspirin EC 81 MG tablet, Take 81 mg by mouth daily., Disp: , Rfl:    atorvastatin (LIPITOR) 20 MG tablet, Take 1 tablet (20 mg total) by mouth daily., Disp: 90 tablet, Rfl: 1   fluticasone (FLONASE) 50 MCG/ACT nasal spray, Place 2 sprays into both nostrils daily., Disp: , Rfl:    hydrochlorothiazide (HYDRODIURIL) 25 MG tablet, TAKE 1 TABLET BY MOUTH  DAILY, Disp: 90 tablet, Rfl: 3   levothyroxine (SYNTHROID) 137 MCG tablet, Take 137 mcg by mouth daily., Disp: , Rfl:    has a past medical history of Adult hypothyroidism, Arthralgia of hand, Benign essential HTN, Mixed hyperlipidemia (01/26/2020), Murmur (01/26/2020), Precordial pain (01/26/2020), and Recurrent genital herpes.    has a past surgical history that includes Tonsillectomy and adenoidectomy; Abdominal hysterectomy; Appendectomy (1996); and Cholecystectomy (2008).   SIGNATURE    Dr. Kalman Shan, M.D., F.C.C.P,   Pulmonary and Critical Care Medicine Staff Physician, [facility] Center Director - Interstitial Lung Disease  Program  Pulmonary Fibrosis [facility] at [facility] [place], [place], [address]  Pager: [phone], If no answer or between  15:00h - 7:00h: call [phone] Telephone: [phone]  1:21 PM 02/04/2024

## 2024-02-05 NOTE — Telephone Encounter (Signed)
 03/05/2024, is blocked for inpatient and other doctor appointments. Pulmonary doctors have availability on Tuesdays, 3/11 and 3/25. Would it be okay to schedule the patient for the 25th?

## 2024-02-06 NOTE — Telephone Encounter (Signed)
 Have not received labs  Will call for them Monday 02/09/24

## 2024-02-06 NOTE — Telephone Encounter (Signed)
 I did a bronch 02/06/2024 at [facility] long an office procedure and 02/06/2024 is Friday. Please ask them again and [facility]

## 2024-02-10 NOTE — Telephone Encounter (Signed)
 I contacted Centralized Scheduling, and Tiffany informed me that there are still no open blocks available, making it not possible to schedule this patient on 3/28. I then reached out to [facility] at [phone]. They informed me that the patient could potentially be scheduled on 03/05/24 if we call the day before. It only depends on if they have any slots available.

## 2024-02-11 NOTE — Telephone Encounter (Signed)
 [facility] labs from 10/27/23 placed in Dr Jane Canary review folder in A pod. Will route to him as [place]

## 2024-02-11 NOTE — Telephone Encounter (Signed)
 Called [facility] and requested labs be faxed to my office  Will await the fax

## 2024-02-18 NOTE — Telephone Encounter (Signed)
 Ok here are my dates  -March 19 Wednesday various times - March 20 Thursday morning -March 26 Wednesday various - March 27 Thursday morning  -7 AM March 25 -> I have a patient in the office at 8:30 AM

## 2024-02-19 ENCOUNTER — Telehealth: Payer: Self-pay | Admitting: Internal Medicine

## 2024-02-19 NOTE — Telephone Encounter (Signed)
 PT calling to make sure Dr. Marchelle Gearing is setting up all the PA's for 2 upcoming procedures. I am not sure if this goes to Nurse or PCC's so sending to both. PT was a little worried and called her insurance who told her we needed to have these in place.  Please call to reassure her @ [phone]

## 2024-02-19 NOTE — Telephone Encounter (Addendum)
 Pt is not able to be scheduled on these days at [facility] Endo. Marchelle Gearing is out of contact until Monday. Carollee Herter with Endo is out currently. Will have to wait until Monday for confirmation if we can schedule the pt on 02/25/24 at 830am. I will contact the pt to let them know that they will have to wait until Monday.

## 2024-02-23 NOTE — Telephone Encounter (Signed)
 Ok to do at [employer] cone 02/25/24 at 8:30am

## 2024-02-24 ENCOUNTER — Encounter: Payer: Self-pay | Admitting: Internal Medicine

## 2024-02-24 ENCOUNTER — Other Ambulatory Visit: Payer: Self-pay

## 2024-02-24 ENCOUNTER — Encounter (HOSPITAL_COMMUNITY): Payer: Self-pay | Admitting: Internal Medicine

## 2024-02-24 NOTE — Progress Notes (Signed)
 Case: [id number] Date/Time: 02/25/24 0830   Procedure: BRONCHOSCOPY, WITH FLUOROSCOPY - No envisia   Anesthesia type: General   Diagnosis: ILD (interstitial lung disease) (HCC) [J84.9]   Pre-op diagnosis: ILD, rule out Chronic HP   [facility]   Surgeons: Kalman Shan, MD       DISCUSSION: Sharon Miranda is a 73 yo female who is being evaluated as SDW prior to procedure above. PMH of HTN, aortic atherosclerosis, heart murmur, ILD, hypothyroidism.  Patient was evaluated by cardiology in 2021 for chest pain.  She underwent a stress test which was low risk and echocardiogram which showed normal LVEF, grade 1 diastolic dysfunction, no significant valvular disease.  VS: There were no vitals taken for this visit.  PROVIDERS: Lise Auer, MD   LABS: n/a  Labs Reviewed - No data to display   IMAGES: CT chest 09/17/2023:   IMPRESSION: 1. The appearance of the lungs does suggest interstitial lung disease, with a spectrum of findings considered most compatible with an alternative diagnosis (not usual interstitial pneumonia) per current ATS guidelines. 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 Repeat high-resolution chest CT is recommended in 12 months to assess for temporal changes in the appearance of the lung parenchyma. 2. Small pulmonary nodules measuring 5 mm or less in size, nonspecific, but statistically likely benign. Attention at time of repeat high-resolution chest CT is recommended to ensure stability or regression of these findings. 3. Small amount of pericardial fluid and/or thickening, unlikely to be of hemodynamic significance at this time. No pericardial calcification at this time. 4. Aortic atherosclerosis, in addition to left anterior descending coronary artery disease. Assessment for potential risk factor modification, dietary therapy or pharmacologic therapy may be warranted, if clinically  indicated.   Aortic Atherosclerosis (ICD10-I70.0).  EKG:   CV: Echo 03/15/2020:  IMPRESSIONS    1. Left ventricular ejection fraction, by estimation, is 60 to 65%. The left ventricle has normal function. The left ventricle has no regional wall motion abnormalities. There is mild left ventricular hypertrophy. Left ventricular diastolic parameters are consistent with Grade I diastolic dysfunction (impaired relaxation).  2. Right ventricular systolic function is normal. The right ventricular size is normal. There is normal pulmonary artery systolic pressure.  3. The mitral valve is normal in structure. No evidence of mitral valve regurgitation. No evidence of mitral stenosis.  4. The aortic valve is normal in structure. Aortic valve regurgitation is not visualized. No aortic stenosis is present.  5. The inferior vena cava is normal in size with greater than 50% respiratory variability, suggesting right atrial pressure of 3 mmHg.  Stress test 02/24/2020:  The left ventricular ejection fraction is hyperdynamic (>65%). Nuclear stress EF: 83%. There was no ST segment deviation noted during stress. No T wave inversion was noted during stress. The study is normal. This is a low risk study.    Past Medical History:  Diagnosis Date   Adult hypothyroidism    Arthralgia of hand    Benign essential HTN    Mixed hyperlipidemia 01/26/2020   Murmur 01/26/2020   Precordial pain 01/26/2020   Recurrent genital herpes     Past Surgical History:  Procedure Laterality Date   ABDOMINAL HYSTERECTOMY     due to endometriosis   APPENDECTOMY  1996   CHOLECYSTECTOMY  2008   TONSILLECTOMY AND ADENOIDECTOMY      MEDICATIONS: No current facility-administered medications for this encounter.    amLODipine (NORVASC) 5 MG tablet  aspirin EC 81 MG tablet   atorvastatin (LIPITOR) 20 MG tablet   fluticasone (FLONASE) 50 MCG/ACT nasal spray   hydrochlorothiazide (HYDRODIURIL) 25 MG tablet    levothyroxine (SYNTHROID) 137 MCG tablet   [name] [facility] Short Stay/Anesthesiology [facility] Phone [phone] 02/24/2024 11:14 AM

## 2024-02-24 NOTE — Telephone Encounter (Signed)
 Case request done.

## 2024-02-24 NOTE — Progress Notes (Signed)
 SDW call  Patient was given pre-op instructions over the phone. Patient verbalized understanding of instructions provided.     PCP - Dr. Luna Kitchens Cardiologist - Dr. Lavona Mound [facility] Pulmonary:    PPM/ICD - denies Device Orders - na Rep Notified - na   Chest x-ray - na EKG -  DOS, 02/25/2024 Stress Test -02/24/2020 ECHO - 03/15/2020 Cardiac Cath -  PFT's: 01/23/2024  Sleep Study/sleep apnea/CPAP: denies  Non-diabetic  Blood Thinner Instructions: denies Aspirin Instructions:states last dose 02/23/2024   ERAS Protcol - NPO   Anesthesia review: Yes. MI, heart murmur, HTN, ILD, HLD   Patient denies shortness of breath, fever, cough and chest pain over the phone call  Your procedure is scheduled on Wednesday February 25, 2024  Report to [facility] Main Entrance "A" at   0630 A.M., then check in with the Admitting office.  Call this number if you have problems the morning of surgery:  [phone]   If you have any questions prior to your surgery date call [phone]: Open Monday-Friday 8am-4pm If you experience any cold or flu symptoms such as cough, fever, chills, shortness of breath, etc. between now and your scheduled surgery, please notify us at the above number    Remember:  Do not eat or drink after midnight the night before your surgery  Take these medicines the morning of surgery with A SIP OF WATER:  Amlodipine, atorvastatin, flonase, synthroid  As of today, STOP taking any Aleve, Naproxen, Ibuprofen, Motrin, Advil, Goody's, BC's, all herbal medications, fish oil, and all vitamins.

## 2024-02-24 NOTE — Telephone Encounter (Signed)
 Bronch has been scheduled. Pt is aware of appointment as well as bronch instructions.

## 2024-02-24 NOTE — Anesthesia Preprocedure Evaluation (Signed)
 Anesthesia Evaluation  Patient identified by MRN, date of birth, ID band Patient awake    Reviewed: Allergy & Precautions, NPO status , Patient's Chart, lab work & pertinent test results  Airway Mallampati: II  TM Distance: >3 FB Neck ROM: Full    Dental  (+) Edentulous Upper, Edentulous Lower, Dental Advisory Given   Pulmonary neg pulmonary ROS   Pulmonary exam normal breath sounds clear to auscultation       Cardiovascular hypertension, Pt. on medications + Past MI  Normal cardiovascular exam+ Valvular Problems/Murmurs  Rhythm:Regular Rate:Normal  Echo 2021  1. Left ventricular ejection fraction, by estimation, is 60 to 65%. The left ventricle has normal function. The left ventricle has no regional wall motion abnormalities. There is mild left ventricular hypertrophy. Left ventricular diastolic parameters are consistent with Grade I diastolic dysfunction (impaired relaxation).   2. Right ventricular systolic function is normal. The right ventricular size is normal. There is normal pulmonary artery systolic pressure.   3. The mitral valve is normal in structure. No evidence of mitral valve regurgitation. No evidence of mitral stenosis.   4. The aortic valve is normal in structure. Aortic valve regurgitation is not visualized. No aortic stenosis is present.   5. The inferior vena cava is normal in size with greater than 50% respiratory variability, suggesting right atrial pressure of 3 mmHg.     Neuro/Psych negative neurological ROS     GI/Hepatic negative GI ROS, Neg liver ROS,,,  Endo/Other  Hypothyroidism    Renal/GU negative Renal ROS     Musculoskeletal negative musculoskeletal ROS (+)    Abdominal   Peds  Hematology negative hematology ROS (+)   Anesthesia Other Findings   Reproductive/Obstetrics                             Anesthesia Physical Anesthesia Plan  ASA: 3  Anesthesia  Plan: General   Post-op Pain Management: Minimal or no pain anticipated   Induction: Intravenous  PONV Risk Score and Plan: 3 and Ondansetron, Dexamethasone, Propofol infusion, TIVA and Treatment may vary due to age or medical condition  Airway Management Planned: Oral ETT  Additional Equipment:   Intra-op Plan:   Post-operative Plan: Extubation in OR  Informed Consent: I have reviewed the patients History and Physical, chart, labs and discussed the procedure including the risks, benefits and alternatives for the proposed anesthesia with the patient or authorized representative who has indicated his/her understanding and acceptance.     Dental advisory given  Plan Discussed with: CRNA  Anesthesia Plan Comments: (See PAT note from 3/18)        Anesthesia Quick Evaluation

## 2024-02-24 NOTE — Addendum Note (Signed)
 Addended by: Kalman Shan on: 02/24/2024 09:05 AM   Modules accepted: Orders

## 2024-02-25 ENCOUNTER — Ambulatory Visit (HOSPITAL_COMMUNITY)

## 2024-02-25 ENCOUNTER — Encounter (HOSPITAL_COMMUNITY): Payer: Self-pay | Admitting: Internal Medicine

## 2024-02-25 ENCOUNTER — Ambulatory Visit (HOSPITAL_COMMUNITY)
Admission: RE | Admit: 2024-02-25 | Discharge: 2024-02-25 | Disposition: A | Discharge status: Home / Self Care | Attending: Internal Medicine | Admitting: Internal Medicine

## 2024-02-25 ENCOUNTER — Encounter (HOSPITAL_COMMUNITY)
Admission: RE | Disposition: A | Discharge status: Home / Self Care | Payer: Self-pay | Source: Home / Self Care | Attending: Internal Medicine

## 2024-02-25 ENCOUNTER — Ambulatory Visit (HOSPITAL_COMMUNITY): Admitting: Medical

## 2024-02-25 ENCOUNTER — Other Ambulatory Visit: Payer: Self-pay

## 2024-02-25 ENCOUNTER — Ambulatory Visit (HOSPITAL_BASED_OUTPATIENT_CLINIC_OR_DEPARTMENT_OTHER): Admitting: Medical

## 2024-02-25 DIAGNOSIS — E039 Hypothyroidism, unspecified: Secondary | ICD-10-CM | POA: Insufficient documentation

## 2024-02-25 DIAGNOSIS — I10 Essential (primary) hypertension: Secondary | ICD-10-CM

## 2024-02-25 DIAGNOSIS — I119 Hypertensive heart disease without heart failure: Secondary | ICD-10-CM | POA: Diagnosis not present

## 2024-02-25 DIAGNOSIS — J849 Interstitial pulmonary disease, unspecified: Secondary | ICD-10-CM | POA: Diagnosis not present

## 2024-02-25 DIAGNOSIS — R011 Cardiac murmur, unspecified: Secondary | ICD-10-CM | POA: Diagnosis not present

## 2024-02-25 DIAGNOSIS — Z8249 Family history of ischemic heart disease and other diseases of the circulatory system: Secondary | ICD-10-CM | POA: Insufficient documentation

## 2024-02-25 DIAGNOSIS — I252 Old myocardial infarction: Secondary | ICD-10-CM | POA: Diagnosis not present

## 2024-02-25 DIAGNOSIS — I700 Atherosclerosis of aorta: Secondary | ICD-10-CM | POA: Diagnosis not present

## 2024-02-25 DIAGNOSIS — E782 Mixed hyperlipidemia: Secondary | ICD-10-CM | POA: Insufficient documentation

## 2024-02-25 DIAGNOSIS — A6000 Herpesviral infection of urogenital system, unspecified: Secondary | ICD-10-CM

## 2024-02-25 DIAGNOSIS — I2510 Atherosclerotic heart disease of native coronary artery without angina pectoris: Secondary | ICD-10-CM | POA: Insufficient documentation

## 2024-02-25 DIAGNOSIS — M25549 Pain in joints of unspecified hand: Secondary | ICD-10-CM

## 2024-02-25 DIAGNOSIS — R072 Precordial pain: Secondary | ICD-10-CM | POA: Diagnosis not present

## 2024-02-25 DIAGNOSIS — J8410 Pulmonary fibrosis, unspecified: Secondary | ICD-10-CM | POA: Diagnosis not present

## 2024-02-25 DIAGNOSIS — J679 Hypersensitivity pneumonitis due to unspecified organic dust: Secondary | ICD-10-CM | POA: Diagnosis not present

## 2024-02-25 DIAGNOSIS — Z48813 Encounter for surgical aftercare following surgery on the respiratory system: Secondary | ICD-10-CM | POA: Diagnosis not present

## 2024-02-25 HISTORY — PX: BRONCHIAL WASHINGS: SHX5105

## 2024-02-25 HISTORY — PX: BRONCHIAL BIOPSY: SHX5109

## 2024-02-25 HISTORY — DX: Acute myocardial infarction, unspecified: I21.9

## 2024-02-25 HISTORY — PX: VIDEO BRONCHOSCOPY: SHX5072

## 2024-02-25 LAB — BODY FLUID CELL COUNT WITH DIFFERENTIAL
Eos, Fluid: 0 %
Lymphs, Fluid: 33 %
Monocyte-Macrophage-Serous Fluid: 45 % — ABNORMAL LOW (ref 50–90)
Neutrophil Count, Fluid: 22 % (ref 0–25)
Total Nucleated Cell Count, Fluid: 39 uL (ref 0–1000)

## 2024-02-25 LAB — PROTIME-INR
INR: 1 (ref 0.8–1.2)
Prothrombin Time: 13.1 s (ref 11.4–15.2)

## 2024-02-25 SURGERY — BRONCHOSCOPY, WITH FLUOROSCOPY
Anesthesia: General

## 2024-02-25 MED ORDER — EPINEPHRINE 1 MG/10ML IJ SOSY
PREFILLED_SYRINGE | INTRAMUSCULAR | Status: AC
  Filled 2024-02-25: qty 10

## 2024-02-25 MED ORDER — DEXAMETHASONE SODIUM PHOSPHATE 10 MG/ML IJ SOLN
INTRAMUSCULAR | Status: DC | PRN
  Administered 2024-02-25: 10 mg via INTRAVENOUS

## 2024-02-25 MED ORDER — ROCURONIUM BROMIDE 10 MG/ML (PF) SYRINGE
PREFILLED_SYRINGE | INTRAVENOUS | Status: DC | PRN
  Administered 2024-02-25: 50 mg via INTRAVENOUS

## 2024-02-25 MED ORDER — SODIUM CHLORIDE (PF) 0.9 % IJ SOLN
PREFILLED_SYRINGE | INTRAMUSCULAR | Status: DC | PRN
  Administered 2024-02-25: 4 mL

## 2024-02-25 MED ORDER — SODIUM CHLORIDE 0.9 % IV SOLN: Freq: Once | INTRAVENOUS | Status: DC

## 2024-02-25 MED ORDER — PROPOFOL 500 MG/50ML IV EMUL
INTRAVENOUS | Status: DC | PRN
  Administered 2024-02-25: 50 ug/kg/min via INTRAVENOUS

## 2024-02-25 MED ORDER — PROPOFOL 10 MG/ML IV BOLUS
INTRAVENOUS | Status: DC | PRN
  Administered 2024-02-25: 50 mg via INTRAVENOUS
  Administered 2024-02-25: 140 mg via INTRAVENOUS

## 2024-02-25 MED ORDER — SUGAMMADEX SODIUM 200 MG/2ML IV SOLN
INTRAVENOUS | Status: DC | PRN
  Administered 2024-02-25: 111.6 mg via INTRAVENOUS

## 2024-02-25 MED ORDER — ONDANSETRON HCL 4 MG/2ML IJ SOLN
INTRAMUSCULAR | Status: DC | PRN
  Administered 2024-02-25: 4 mg via INTRAVENOUS

## 2024-02-25 MED ORDER — LIDOCAINE 2% (20 MG/ML) 5 ML SYRINGE
INTRAMUSCULAR | Status: DC | PRN
  Administered 2024-02-25: 50 mg via INTRAVENOUS

## 2024-02-25 MED ORDER — LACTATED RINGERS IV SOLN
INTRAVENOUS | Status: DC | PRN
Start: 2024-02-25 — End: 2024-02-25

## 2024-02-25 NOTE — Transfer of Care (Signed)
 Immediate Anesthesia Transfer of Care Note  Patient: [facility]  Procedure(s) Performed: BRONCHOSCOPY, WITH FLUOROSCOPY IRRIGATION, BRONCHUS BRONCHOSCOPY, WITH BIOPSY  Patient Location: PACU  Anesthesia Type:General  Level of Consciousness: awake, alert , and oriented  Airway & Oxygen Therapy: Patient Spontanous Breathing  Post-op Assessment: Report given to RN and Post -op Vital signs reviewed and stable  Post vital signs: Reviewed and stable  Last Vitals:  Vitals Value Taken Time  BP 146/87 02/25/24 0913  Temp 98   Pulse 79 02/25/24 0913  Resp 20 02/25/24 0913  SpO2 92 % 02/25/24 0913    Last Pain:  Vitals:   02/25/24 0810  TempSrc: Temporal  PainSc: 0-No pain         Complications: No notable events documented.

## 2024-02-25 NOTE — Interval H&P Note (Signed)
 History and Physical Interval Note:  02/25/2024 8:32 AM  Sharon Miranda  has presented today for surgery, with the diagnosis of ILD, rule out Chronic HP.  The various methods of treatment have been discussed with the patient and family. After consideration of risks, benefits and other options for treatment, the patient has consented to  Procedure(s) with comments: BRONCHOSCOPY, WITH FLUOROSCOPY (N/A) - No envisia as a surgical intervention.  The patient's history has been reviewed, patient examined, no change in status, stable for surgery.  I have reviewed the patient's chart and labs.  Questions were answered to the patient's satisfaction.    NPO confirmed Interim Health status: No new complaints No new medical problems. No new surgeries. No ER visits. No Urgent care visits. No changes to medications   General: No distress. Looks well Neuro: Alert and Oriented x 3. GCS 15. Speech normal Psych: Pleasant Resp:  Barrel Chest - no.  Wheeze - YES SQUEAKS, Crackles - no, No overt respiratory distress CVS: Normal heart sounds. Murmurs - no Ext: Stigmata of Connective Tissue Disease - no HEENT: Normal upper airway. PEERL +. No post nasal drip    A ILD with difffuse GGO Suspect HP  Plan Risks of pneumothorax, hemothorax, sedation/anesthesia complications such as cardiac or respiratory arrest or hypotension, stroke and bleeding all explained. Benefits of diagnosis but limitations of non-diagnosis also explained. Patient verbalized understanding and wished to proceed.     SIGNATURE    Dr. Kalman Shan, M.D., F.C.C.P,  Pulmonary and Critical Care Medicine Staff Physician, [facility] Director - Interstitial Lung Disease  Program  Pulmonary Fibrosis [facility] [place], [place], [address]   Pager: [phone], If no answer  -> Check AMION or Try [phone] Telephone (clinical office): [phone] Telephone (research): [phone]  8:34 AM 02/25/2024  Kalman Shan

## 2024-02-25 NOTE — Discharge Instructions (Signed)
 BRONCHOSCOPY DISCHARGE INSTRUCTIONS  - Please have someone to drive you home - Please be careful with activities for next 24 hours - You can eat 2-4 hours after getting home provided you are fully alert, able to cough, and are not nauseated or vomiting and     feel well - You are expected to have low grade fever or cough some amount of blood for next 24-48 hours; if this worsens call us - If you are very short of breath or coughing blood or chest pain or not feeling well, call us [phone] anytime or go to emergency room - For followup appointment - see below. If not there please call [phone] to make an appointment with Dr Marchelle Gearing or nurse practitioner   Future Appointments  Date Time Provider Department Center  04/30/2024 11:15 AM Kalman Shan, MD [facility] None

## 2024-02-25 NOTE — Anesthesia Postprocedure Evaluation (Signed)
 Anesthesia Post Note  Patient: Sharon Miranda  Procedure(s) Performed: BRONCHOSCOPY, WITH FLUOROSCOPY IRRIGATION, BRONCHUS BRONCHOSCOPY, WITH BIOPSY     Patient location during evaluation: PACU Anesthesia Type: General Level of consciousness: sedated and patient cooperative Pain management: pain level controlled Vital Signs Assessment: post-procedure vital signs reviewed and stable Respiratory status: spontaneous breathing Cardiovascular status: stable Anesthetic complications: no   No notable events documented.  Last Vitals:  Vitals:   02/25/24 1000 02/25/24 1030  BP: 115/64 114/75  Pulse: 75 75  Resp: (!) 25 19  Temp:    SpO2: 91% 93%    Last Pain:  Vitals:   02/25/24 1030  TempSrc:   PainSc: 0-No pain                 Lewie Loron

## 2024-02-25 NOTE — Anesthesia Procedure Notes (Signed)
 Procedure Name: Intubation Date/Time: 02/25/2024 8:40 AM  Performed by: Darryl Nestle, CRNAPre-anesthesia Checklist: Patient identified, Emergency Drugs available, Suction available and Patient being monitored Patient Re-evaluated:Patient Re-evaluated prior to induction Oxygen Delivery Method: Circle system utilized Preoxygenation: Pre-oxygenation with 100% oxygen Induction Type: IV induction Ventilation: Mask ventilation without difficulty Laryngoscope Size: Mac and 3 Grade View: Grade I Tube type: Oral Tube size: 8.5 mm Number of attempts: 1 Airway Equipment and Method: Stylet and Oral airway Placement Confirmation: ETT inserted through vocal cords under direct vision, positive ETCO2 and breath sounds checked- equal and bilateral Secured at: 22 cm Tube secured with: Tape Dental Injury: Teeth and Oropharynx as per pre-operative assessment

## 2024-02-25 NOTE — Op Note (Signed)
 Name:  Bellamia Ferch MRN:  [id number] DOB:  [birth year]  PROCEDURE NOTE  Procedure(s): Flexible bronchoscopy [phone]) Bronchial alveolar lavage [phone]) of the Right Middle Lob Transbronchial lung biopsy, single lobe ([id number]) of the Right Lower Lobe x 4 piecs  Indications:  ILD suspect HP  Consent:  Procedure, benefits, risks and alternatives discussed.  Questions answered.  Consent obtained.  Anesthesia:   GA  Location: [employer]  Procedure summary:  Appropriate equipment was assembled.  The patient was brought to the procedure suite room and identified as [facility] with September 03, 1951  Safety timeout was performed. The patient was placed supine on the  table, airway and  GA given by anesthesia team  After the appropriate level of GA was assured, flexible video bronchoscope was lubricated and inserted through the  ET TUBE Total of 0 mL of 1% Lidocaine were administered through the bronchoscope to augment moderate sedation  Airway examination was YES performed bilaterally to subsegmental level.  Minimal clear secretions were noted, mucosa appeared normal and NO endobronchial lesions were identified.  Bronchial alveolar lavage of the RML was performed with 20mL to waste and then 40 mL of normal saline  x3  number of times for total volume of 120 mL. Total return of 50  mL of grey pinnk fluid, after which the bronchoscope was withdrawn.   Flexible video bronchoscope was used again to perform TRANSBRONHIAL biopsies of   - RIGHT LOWER LOBE YES - LOWER LOBE x 4 times  DOne. PRe Rx with cold saline and dilted epi done. There was oozing of blood from 1 piece of the RLL medial segment but this settled. Loss < 1mL  After hemostasis was assure, the bronchoscope was withdrawn.  The patient was recovered and then  transferred to recovery area  Post-procedure chest x-ray was YES ordered.  Specimens sent: Bronchial alveolar lavage specimen of the RLL for cell count (including CD4/CD8  assessment), microbiology and cytology.  Complications:  No immediate complications were noted.  Hemodynamic parameters and oxygenation remained stable throughout the procedure.  Estimated blood loss:  < 1 mL  IMPRESSION 1. NORMAL AIRWAY 2. RML BAL 3. RLL TRansbrocncial biops x 4 for surg path 4 Envisia classifer sent - NO  Followup Future Appointments  Date Time Provider Department Center  02/25/2024  9:20 AM [facility] PORT 23 [facility]  04/30/2024 11:15 AM Kalman Shan, MD [facility] None     Dr. Kalman Shan, M.D., [facility].C.P Pulmonary and Critical Care Medicine Staff Physician [facility] Pager: [phone], If no answer or between  15:00h - 7:00h: call [phone]  02/25/2024 9:14 AM

## 2024-02-26 LAB — CYTOLOGY - NON PAP

## 2024-02-26 LAB — ACID FAST SMEAR (AFB, MYCOBACTERIA): Acid Fast Smear: NEGATIVE

## 2024-02-27 ENCOUNTER — Encounter (HOSPITAL_COMMUNITY): Payer: Self-pay | Admitting: Internal Medicine

## 2024-02-28 LAB — CULTURE, RESPIRATORY W GRAM STAIN: Culture: NO GROWTH

## 2024-03-01 LAB — SURGICAL PATHOLOGY

## 2024-03-08 ENCOUNTER — Telehealth: Payer: Self-pay | Admitting: Internal Medicine

## 2024-03-08 NOTE — Telephone Encounter (Signed)
 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 I need to do steroids. Meanwhile she needs to have gotten rid of all down stuff from the house and ensure there eis no mold. Happy to see her 04/02/24 video visit

## 2024-03-08 NOTE — Telephone Encounter (Signed)
They were normal

## 2024-03-09 NOTE — Telephone Encounter (Signed)
 I called and spoke with the pt and notified her of results  She verbalized understanding  She prefers in person appts- does no have computer at home  I scheduled her for 04/01/24 at 10:30   MR- did you want to go ahead and call in steroids now? Or wait until appt?

## 2024-03-10 NOTE — Telephone Encounter (Signed)
 Wait till appt to start steroids but if getting worse call

## 2024-03-11 NOTE — Telephone Encounter (Signed)
Spoke with the pt and notified of response per Dr Ramaswamy. She verbalized understanding. Nothing further needed.  

## 2024-03-16 ENCOUNTER — Telehealth: Payer: Self-pay

## 2024-03-16 NOTE — Telephone Encounter (Signed)
 Copied from CRM [id number]. Topic: Clinical - Lab/Test Results >> Mar 16, 2024  9:33 AM Orinda Kenner C wrote: Reason for CRM: Patient [phone] or [phone] wants to speak with nurse on bronchoscopy results. Patient denies having any systems at the moment. Please call back today.  Pt completed her Bronch on 03-03-2024. I called and spoke to pt. Pt stated she was wanting the results to her bronch. I informed pt that it is to soon for [place] to have these results but we should have them before her scheduled appointment with Dr Marchelle Gearing on 04-02-24. Pt verbalized understanding. nfn

## 2024-03-17 DIAGNOSIS — L638 Other alopecia areata: Secondary | ICD-10-CM | POA: Diagnosis not present

## 2024-03-17 DIAGNOSIS — L649 Androgenic alopecia, unspecified: Secondary | ICD-10-CM | POA: Diagnosis not present

## 2024-03-26 LAB — FUNGUS CULTURE RESULT

## 2024-03-26 LAB — FUNGUS CULTURE WITH STAIN

## 2024-03-26 LAB — FUNGAL ORGANISM REFLEX

## 2024-04-02 ENCOUNTER — Encounter: Payer: Self-pay | Admitting: Internal Medicine

## 2024-04-02 ENCOUNTER — Ambulatory Visit: Admitting: Internal Medicine

## 2024-04-02 VITALS — BP 116/70 | HR 90 | Ht 61.0 in | Wt 124.4 lb

## 2024-04-02 DIAGNOSIS — J672 Bird fancier's lung: Secondary | ICD-10-CM

## 2024-04-02 DIAGNOSIS — Z7951 Long term (current) use of inhaled steroids: Secondary | ICD-10-CM | POA: Diagnosis not present

## 2024-04-02 DIAGNOSIS — Z5181 Encounter for therapeutic drug level monitoring: Secondary | ICD-10-CM

## 2024-04-02 MED ORDER — PREDNISONE 10 MG PO TABS: ORAL_TABLET | ORAL | 0 refills | Status: DC

## 2024-04-02 NOTE — Progress Notes (Signed)
 OV 08/19/2023  Subjective:  Patient ID: Sharon Miranda, female , DOB: 07-20-1951 , age 73 y.o. , MRN: [id number] , ADDRESS: [address] [place] [address] PCP Beecher Bower, MD Patient Care Team: Beecher Bower, MD as PCP - General (Family Medicine) Tobb, Kardie, DO as PCP - Cardiology (Cardiology)  This Provider for this visit: Treatment Team:  Attending Provider: Maire Scot, MD    08/19/2023 -   Chief Complaint  Patient presents with   Consult    Consult for possible ILD, rattle in chest     HPI [facility] 73 y.o. -referred for a new consult because primary care physician heard a rattle in the chest.  She said this was recently a few months ago but she says she is completely asymptomatic.  She says she never gets sick she gets a flu shot and vaccines and never reacts to it.  There is no hospitalizations no ER visits no urgent care visits no surgeries.  She walks half mile per day.  Therefore she is surprised.  On exam she actually has a wheeze.  She did have a chest x-ray in the outside health system in 2023.  I am unable to pull the image but it shows she has potential chronic bronchitis.  She has never had COVID  Nevertheless she was sent in ILD questionnaire and from a exposure standpoint  Symptoms: No shortness of breath no coughing no wheezing no nausea vomiting or diarrhea  Family history of lung disease: Denies  Exposure history: No smoking no marijuana no cocaine no intravenous drug use  Home environment history: Lives in a 75 year old home by herself.  She says she is divorced.  No kids.  No organic exposures in the house.  She used to work in [employer] and then she is retired.  Occupational exposures extensive organic and inorganic antigen exposure history is negative  Medication history: Negative for any drugs that cause ILD or eosinophilic pneumonias.-   FENO 08/19/23  - could NOT do \     OV 10/13/13 wth APP  10/14/2023 Patient  presents today for follow-up.  Seen by Dr. Bertrum Brodie on 08/19/2023 due to wheezing symptoms.  Referred by Dr. Meredeth Stallion. Concern for asthma.  Patient was ordered for pulmonary function testing, high-resolution CAT scan and RAST allergy panel. Presents today to review testing and having PFTs.   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  Recommend repeat imaging in 12 months to assess for temporal changes.  He had small pulmonary nodule measuring 5 mm, nonspecific and felt likely to be benign.  Mild amount of pericardial fluid, unlikely to be hemodynamic significance at this time.  Aortic arthrosclerosis in addition to left anterior descending coronary artery disease. Her allergy testing was normal. She tells me she had PFTs at [facility]   No problem breathing  No wheezing  Occasional cough, more throat clearing  History of Present Illness   The patient, with a history of hypertension, hypothyroidism, hyperlipidemia, and a heart murmur, was referred for a consultation due to wheezing symptoms noted by her primary care physician. The patient denies any breathing difficulties and maintains an active lifestyle, including yard work. She reports occasional cough, which she describes more as throat clearing, and denies any symptoms of reflux or GERD. She only uses nebulizer during respiratory infections but does not regularly require.  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  CT scan also revealed a small pulmonary nodule and some fluid or thickening around the heart. The patient has a history of coronary artery disease and a previous heart attack, which was discovered incidentally during a visit for a broken leg.    OV 02/04/2024  Subjective:  Patient ID: Sharon Miranda, female , DOB: November 25, 1951 , age 24 y.o. , MRN: [id number] , ADDRESS: [address] [place] [address] PCP Beecher Bower, MD Patient Care Team: Beecher Bower, MD as PCP - General (Family Medicine) Tobb, Kardie, DO as PCP - Cardiology (Cardiology)  This Provider for this visit: Treatment Team:  Attending Provider: Maire Scot, MD    02/04/2024 -   Chief Complaint  Patient presents with   Follow-up    Breathing is doing great. She has occ dry cough.      HPI [facility] 73 y.o. -returns for follow-up.  Today I learned that she is also a friend of Rory Collard who is a mutual friend.  She tells me she continues to be asymptomatic.  She did see Irby Mannan.  Autoimmune profile was done but she did this at the primary care office and I do not have the results.  Apparently the results were faxed.  I have contacted our triage desk to get this.  However on exam today she does have squeaking wheezing that is scattered.  Pulmonary function test is normal.  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 She admitted to longstanding usage of down pillow and down comforter.      OV 04/02/2024  Subjective:  Patient ID: Sharon Miranda, female , DOB: [birth year] , age 49 y.o. , MRN: [id number] , ADDRESS: [address] [place] [address] PCP Beecher Bower, MD Patient Care Team: Beecher Bower, MD as PCP - General (Family Medicine) Tobb, Kardie, DO as PCP - Cardiology (Cardiology)  This Provider for this visit: Treatment Team:  Attending Provider: Maire Scot, MD    04/02/2024 -   Chief Complaint  Patient presents with   Follow-up    Breathing is doing well. She was able to work outside in her yard for 4 hours one day this wk.     HPI [facility] 73 y.o. -returns for follow-up.  Presents with her friend.  She continues to be asymptomatic. Interim Health status: No new complaints No new medical problems. No new surgeries. No ER visits. No Urgent  care visits. No changes to medications.  Last visit with me was mid March 2020 for the time of bronchoscopy.  Bronchoscopy lavage shows no organisms but shows greater than 30% lymphocytes and transbronchial biopsy shows noncaseating granuloma.  All this along with exposure of down pillow and mattress this is consistent with AVN hypersensitive pneumonitis.  I visualized the CT scan again with her shows groundglass opacities and air trapping.  She continues to have the squeaks in the lung.  We discussed the need for chronic steroids.  Discussed the possibility that we can resolve the issue.  Also told her to do mold inspection.  She has now gotten rid of all the down related exposures at home.  She admitted she has been exposed to bird feathers for many years now.  Frined reports patient is stable and asymptomatic   In terms of chronic steroids talked about hyperglycemia hypertension weight gain Cushing features osteoporosis cataracts.  Explained to her  that the course would be 2-3 months in order to limit risk but also to gain benefits.  Also talked about weight gain and myalgias.  She is going to monitor herself for all of this.  Will get a blood QuantiFERON gold today.      SYMPTOM SCALE - ILD 02/04/2024 and 04/02/2024   Current weight   O2 use ra  Shortness of Breath 0 -> 5 scale with 5 being worst (score 6 If unable to do)  At rest 0  Simple tasks - showers, clothes change, eating, shaving 0  Household (dishes, doing bed, laundry) 0  Shopping 0  Walking level at own pace 0  Walking up Stairs 0  Total (30-36) Dyspnea Score 0  How bad is your cough? Very seldom  How bad is your fatigue 0  How bad is nausea 0  How bad is vomiting?  0  How bad is diarrhea? 0  How bad is anxiety? 0  How bad is depression 0  Any chronic pain - if so where and how bad 0        SIT STAND TEST - goal 15 times   04/02/2024    O2 used ra   PRobe - finter or forehead finer   Number sit and stand  completed - goal 15 15   Time taken to complete 56   Resting Pulse Ox/HR/Dyspnea  96% and 90/min and dyspnea of 1/10    Peak measures 94 % and 110/min and dyspnea of 1/10   Final Pulse Ox/HR 95% and 92/min and dyspnea of 1/10   Desaturated </= 88% no   Desaturated <= 3% points no   Got Tachycardic >/= 90/min yes   Miscellaneous comments zx      PFT     Latest Ref Rng & Units 01/23/2024    9:36 AM  PFT Results  FVC-Pre L 1.96   FVC-Predicted Pre % 78   FVC-Post L 2.05   FVC-Predicted Post % 82   Pre FEV1/FVC % % 84   Post FEV1/FCV % % 83   FEV1-Pre L 1.65   FEV1-Predicted Pre % 88   FEV1-Post L 1.71   DLCO uncorrected ml/min/mmHg 18.21   DLCO UNC% % 105   DLCO corrected ml/min/mmHg 18.21   DLCO COR %Predicted % 105   DLVA Predicted % 131   TLC L 4.55   TLC % Predicted % 98   RV % Predicted % 108     Latest Reference Range & Units 02/25/24 09:09  Monocyte-Macrophage-Serous Fluid 50 - 90 % 45 (L)  Other Cells, Fluid % Linning cells noted.  Color, Fluid  COLORLESS  Total Nucleated Cell Count, Fluid 0 - 1,000 cu mm 39  Fluid Type-FCT  Bronch Lavag NO2 RML (C)  Lymphs, Fluid % 33  Eos, Fluid % 0  Appearance, Fluid CLEAR  HAZY !  Neutrophil Count, Fluid 0 - 25 % 22  Gram Stain  RARE WBC PRESENT, PREDOMINANTLY PMN NO ORGANISMS SEEN   ACID FAST CULTURE WITH REFLEXED SENSITIVITIES (MYCOBACTERIA)  Rpt (IP)  ACID FAST SMEAR (AFB, MYCOBACTERIA)  Rpt  CULTURE, RESPIRATORY W GRAM STAIN  Rpt  FUNGAL ORGANISM REFLEX  Rpt  FUNGUS CULTURE RESULT  Rpt (C)  FUNGUS CULTURE WITH STAIN  Rpt  (L): Data is abnormally low !: Data is abnormal (C): Corrected (IP): In Process Rpt: View report in Results Review for more information    LAB RESULTS last 96 hours No results found.  FINAL MICROSCOPIC DIAGNOSIS:   A. LUNG, RIGHT LOWER LOBE, TRANSBRONCHIAL BIOPSY:  - Bronchial wall and lung parenchyma showing few well-formed,  nonnecrotizing granulomas, see comment       has a past medical history of Adult hypothyroidism, Arthralgia of hand, Benign essential HTN, Mixed hyperlipidemia (01/26/2020), Murmur (01/26/2020), Myocardial infarction [facility]), Precordial pain (01/26/2020), and Recurrent genital herpes.   reports that she has never smoked. She has never used smokeless tobacco.  Past Surgical History:  Procedure Laterality Date   ABDOMINAL HYSTERECTOMY     due to endometriosis   APPENDECTOMY  1996   BRONCHIAL BIOPSY  02/25/2024   Procedure: BRONCHOSCOPY, WITH BIOPSY;  Surgeon: Maire Scot, MD;  Location: [facility] ENDOSCOPY;  Service: Endoscopy;;   BRONCHIAL WASHINGS  02/25/2024   Procedure: IRRIGATION, BRONCHUS;  Surgeon: Maire Scot, MD;  Location: [facility] ENDOSCOPY;  Service: Endoscopy;;   CHOLECYSTECTOMY  2008   TONSILLECTOMY AND ADENOIDECTOMY     VIDEO BRONCHOSCOPY N/A 02/25/2024   Procedure: BRONCHOSCOPY, WITH FLUOROSCOPY;  Surgeon: Maire Scot, MD;  Location: [facility] ENDOSCOPY;  Service: Endoscopy;  Laterality: N/A;  No envisia    Allergies  Allergen Reactions   Codeine Sulfate [Codeine] Itching    Immunization History  Administered Date(s) Administered   Influenza, Quadrivalent, Recombinant, Inj, Pf 08/20/2021, 08/21/2023   Influenza-Unspecified 10/09/2018    Family History  Problem Relation Age of Onset   Heart attack Mother    Heart attack Brother    COPD Brother    Heart attack Brother    Addison's disease Brother    Leukemia Brother      Current Outpatient Medications:    amLODipine  (NORVASC ) 5 MG tablet, TAKE 1 TABLET BY MOUTH  DAILY, Disp: 15 tablet, Rfl: 0   aspirin EC 81 MG tablet, Take 81 mg by mouth daily., Disp: , Rfl:    atorvastatin  (LIPITOR) 20 MG tablet, Take 1 tablet (20 mg total) by mouth daily., Disp: 90 tablet, Rfl: 1   fluticasone (FLONASE) 50 MCG/ACT nasal spray, Place 2 sprays into both nostrils daily., Disp: , Rfl:    hydrochlorothiazide  (HYDRODIURIL ) 25 MG tablet, TAKE 1 TABLET BY MOUTH  DAILY, Disp: 90  tablet, Rfl: 3   levothyroxine (SYNTHROID) 137 MCG tablet, Take 137 mcg by mouth daily., Disp: , Rfl:    predniSONE (DELTASONE) 10 MG tablet, 4 daily x 2 wks, 3 daily x 2 wks, 2 daily x 2 wks, 1 daily x 2 wks, 1/2 daily x 2 wks, 1/2 mon, wed, fri x 2 wks and then stop, Disp: 150 tablet, Rfl: 0      Objective:   Vitals:   04/02/24 1014 04/02/24 1017  BP: 116/70   Pulse: 90   SpO2: 96%   Weight:  124 lb 6.4 oz (56.4 kg)  Height:  5\' 1"  (1.549 m)    Estimated body mass index is 23.51 kg/m as calculated from the following:   Height as of this encounter: 5\' 1"  (1.549 m).   Weight as of this encounter: 124 lb 6.4 oz (56.4 kg).  @WEIGHTCHANGE @  [employer]   04/02/24 1017  Weight: 124 lb 6.4 oz (56.4 kg)     Physical Exam   General: No distress. Looks well O2 at rest: no Cane present: no Sitting in wheel chair: no Frail: no Obese: no Neuro: Alert and Oriented x 3. GCS 15. Speech normal Psych: Pleasant Resp:  Barrel Chest - no.  Wheeze - no, Crackles - no SQUAKES +, No overt respiratory distress CVS: Normal heart sounds.  Murmurs - no Ext: Stigmata of Connective Tissue Disease - no HEENT: Normal upper airway. PEERL +. No post nasal drip        Assessment:       ICD-10-CM   1. Avian hypersensitivity pneumonitis (HCC)  J67.2 QuantiFERON-TB Gold Plus    QuantiFERON-TB Gold Plus    2. Current chronic use of inhaled steroid  Z79.51 QuantiFERON-TB Gold Plus    QuantiFERON-TB Gold Plus    3. Encounter for therapeutic drug monitoring  Z51.81          Plan:     Patient Instructions     ICD-10-CM   1. 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  This along with exposure to down jacket and down pillow makes concern for hypersensitive pneumonitis really high.  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    PLAN  - Glad you got rid of all down comforter pillow and jackets and mattresses - Still recommend doing a mold inspection in the house and in the crawlspace in the basement if you have those -Check blood test for QuantiFERON gold today  -Recommend chronic prednisone as follows  - Prednisone 40 mg daily x 2 weeks and then 30 mg daily x 2 weeks and then 20 mg daily x 2 weeks and then 10 mg daily x 2 weeks and then 5 mg daily x 2 weeks and then 5 mg on Monday Wednesday Friday x 2 weeks and stop  - Monitor for various steroid side effects -that we discussed  Followup  - 4-6 weeks with nurse practitioner to ensure no steroid toxicity  - check HGb!C at [facility]   FOLLOWUP Return in about 6 weeks (around 05/14/2024) for with any of the APPS, Face to Face Visit.   HIGh Complexity  OFFICE   2021 E/M guidelines, first released in 2021, with minor revisions added in 2023. Must meet the requirements for 2 out of 3 dimensions to qualify.    Number and complexity of problems addressed Amount and/or complexity of data reviewed Risk of complications and/or morbidity  Severe exacerbation of chronic illness  Acute or chronic illnesses that may pose a threat to life or bodily function, e.g., multiple trauma, acute MI, pulmonary embolus, severe respiratory distress, progressive rheumatoid arthritis, psychiatric illness with potential threat to self or others, peritonitis, acute renal failure, abrupt change in neurological status Must meet the requirements for 2 of 3 of the categories)  Category 1: Tests and documents, historian  Any combination of 3 of the following:  Assessment requiring an independent historian fried  Review of prior external note(s) from each unique source  Review of results of each unique test  Ordering of each unique  test    Category 2: Interpretation of tests    Independent interpretation of a test performed by another physician/other qualified health care professional (not separately reported) TCT chest re=eview   Category 3: Discuss management/tests  Discussion of management or test interpretation with external physician/other qualified health care professional/appropriate source (not separately reported)  HIGH risk of morbidity from additional diagnostic testing or treatment Examples only:  Drug therapy requiring intensive monitoring for toxicity  Decision for elective major surgery with identified pateint or procedure risk factors  Decision  regarding hospitalization or escalation of level of care  Decision for DNR or to de-escalate care   Parenteral controlled  substances           SIGNATURE    Dr. Maire Scot, M.D., F.C.C.P,  Pulmonary and Critical Care Medicine Staff Physician, [facility] Center Director - Interstitial Lung Disease  Program  Pulmonary Fibrosis [facility] [place], [place], [address]  Pager: [phone], If no answer or between  15:00h - 7:00h: call [phone] Telephone: [phone]  11:48 AM 04/02/2024

## 2024-04-02 NOTE — Patient Instructions (Addendum)
 ICD-10-CM   1. 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  This along with exposure to down jacket and down pillow makes concern for hypersensitive pneumonitis really high.  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    PLAN  - Glad you got rid of all down comforter pillow and jackets and mattresses - Still recommend doing a mold inspection in the house and in the crawlspace in the basement if you have those -Check blood test for QuantiFERON gold today  -Recommend chronic prednisone as follows  - Prednisone 40 mg daily x 2 weeks and then 30 mg daily x 2 weeks and then 20 mg daily x 2 weeks and then 10 mg daily x 2 weeks and then 5 mg daily x 2 weeks and then 5 mg on Monday Wednesday Friday x 2 weeks and stop  - Monitor for various steroid side effects -that we discussed  Followup  - 4-6 weeks with nurse practitioner to ensure no steroid toxicity  - check HGb!C at [facility]

## 2024-04-06 LAB — QUANTIFERON-TB GOLD PLUS
Mitogen-NIL: 6.58 [IU]/mL
NIL: 0.14 [IU]/mL
QuantiFERON-TB Gold Plus: NEGATIVE
TB1-NIL: 0 [IU]/mL
TB2-NIL: <0 [IU]/mL

## 2024-04-09 LAB — ACID FAST CULTURE WITH REFLEXED SENSITIVITIES (MYCOBACTERIA): Acid Fast Culture: NEGATIVE

## 2024-04-12 ENCOUNTER — Telehealth: Payer: Self-pay

## 2024-04-12 DIAGNOSIS — R3589 Other polyuria: Secondary | ICD-10-CM

## 2024-04-12 DIAGNOSIS — Z7951 Long term (current) use of inhaled steroids: Secondary | ICD-10-CM

## 2024-04-12 NOTE — Telephone Encounter (Signed)
 Blood sugar maybe high. Has she had bladder issues before prednisone   Plan  - check bmet  -check hgb1c

## 2024-04-12 NOTE — Telephone Encounter (Signed)
 Copied from CRM [id number]. Topic: Clinical - Prescription Issue >> Apr 12, 2024  8:55 AM Hilton Lucky wrote: Reason for CRM: Patient is calling to state that she is using the restroom much more than normal on prednisone  morning and night. Patient is requesting provider's nurse call her to discuss. Patient has completed a full weeks dosage.  Patient is requesting a call back.  Spoke with Remonia Carmin. Pt states she is having trouble holding bladder during night time. Pt started prednisone  1 wk ago. Pt is on 2nd week of taking pred. Pt does notice increase in appetite in morning. Please advise Dr. Bertrum Brodie

## 2024-04-13 NOTE — Telephone Encounter (Signed)
 Spoke with the pt  She is aware of  MR's response  Lab orders placed  Nothing further needed

## 2024-04-13 NOTE — Telephone Encounter (Signed)
 I called the pt and there was no answer- LMTCB. ?

## 2024-04-14 ENCOUNTER — Other Ambulatory Visit (INDEPENDENT_AMBULATORY_CARE_PROVIDER_SITE_OTHER)

## 2024-04-14 DIAGNOSIS — R3589 Other polyuria: Secondary | ICD-10-CM | POA: Diagnosis not present

## 2024-04-14 DIAGNOSIS — Z7951 Long term (current) use of inhaled steroids: Secondary | ICD-10-CM

## 2024-04-14 LAB — BASIC METABOLIC PANEL WITH GFR
BUN: 17 mg/dL (ref 6–23)
CO2: 32 meq/L (ref 19–32)
Calcium: 9.6 mg/dL (ref 8.4–10.5)
Chloride: 96 meq/L (ref 96–112)
Creatinine, Ser: 0.65 mg/dL (ref 0.40–1.20)
GFR: 86.37 mL/min (ref >60.00)
Glucose, Bld: 82 mg/dL (ref 70–99)
Potassium: 4.5 meq/L (ref 3.5–5.1)
Sodium: 134 meq/L — ABNORMAL LOW (ref 135–145)

## 2024-04-14 LAB — HEMOGLOBIN A1C: Hgb A1c MFr Bld: 5.7 % (ref 4.6–6.5)

## 2024-04-19 NOTE — Telephone Encounter (Signed)
 Blood glucose is normal at 82.  Hemoglobin A1c is also normal at 5.7.  Sodium is slightly low.  At this point in time I would say that she needs to talk to her primary care physician about her symptoms.  Symptoms might not be related to steroids OR too early to tell

## 2024-04-19 NOTE — Telephone Encounter (Signed)
 Spoke with pt and notified of response from MR  She verbalized understanding  Nothing further needed

## 2024-04-19 NOTE — Telephone Encounter (Signed)
 Copied from CRM [id number]. Topic: Clinical - Lab/Test Results >> Apr 19, 2024 10:01 AM Margarette Shawl wrote: Reason for CRM:   Pt is calling in regards to labs performed 05/07, ordered by [facility].  No messages attached to results to relay to pt.  Requesting call back to review results.  CB#[phone]   Pt calling office regarding lab results from 5/7. Please advise Dr. Bertrum Brodie.

## 2024-04-21 DIAGNOSIS — Z6823 Body mass index (BMI) 23.0-23.9, adult: Secondary | ICD-10-CM | POA: Diagnosis not present

## 2024-04-21 DIAGNOSIS — J672 Bird fancier's lung: Secondary | ICD-10-CM | POA: Diagnosis not present

## 2024-04-30 ENCOUNTER — Ambulatory Visit: Payer: Medicare Other | Admitting: Internal Medicine

## 2024-05-05 DIAGNOSIS — C44629 Squamous cell carcinoma of skin of left upper limb, including shoulder: Secondary | ICD-10-CM | POA: Diagnosis not present

## 2024-05-06 ENCOUNTER — Ambulatory Visit: Payer: Self-pay | Admitting: Internal Medicine

## 2024-05-06 NOTE — Progress Notes (Signed)
 Normal Quant gold

## 2024-05-25 DIAGNOSIS — Z1231 Encounter for screening mammogram for malignant neoplasm of breast: Secondary | ICD-10-CM | POA: Diagnosis not present

## 2024-05-26 ENCOUNTER — Encounter: Payer: Self-pay | Admitting: Nurse Practitioner

## 2024-05-26 ENCOUNTER — Ambulatory Visit: Admitting: Nurse Practitioner

## 2024-05-26 VITALS — BP 133/71 | HR 92 | Ht 61.0 in | Wt 128.0 lb

## 2024-05-26 DIAGNOSIS — J672 Bird fancier's lung: Secondary | ICD-10-CM | POA: Diagnosis not present

## 2024-05-26 DIAGNOSIS — M05749 Rheumatoid arthritis with rheumatoid factor of unspecified hand without organ or systems involvement: Secondary | ICD-10-CM | POA: Insufficient documentation

## 2024-05-26 DIAGNOSIS — J849 Interstitial pulmonary disease, unspecified: Secondary | ICD-10-CM | POA: Diagnosis not present

## 2024-05-26 DIAGNOSIS — M05741 Rheumatoid arthritis with rheumatoid factor of right hand without organ or systems involvement: Secondary | ICD-10-CM | POA: Diagnosis not present

## 2024-05-26 HISTORY — DX: Rheumatoid arthritis with rheumatoid factor of unspecified hand without organ or systems involvement: M05.749

## 2024-05-26 NOTE — Progress Notes (Signed)
 @Patient  ID: Sharon Miranda, female    DOB: 1951-08-06, 73 y.o.   MRN: [id number]  Chief Complaint  Patient presents with   Follow-up    Breathing has been good     Referring provider: Beecher Bower, MD  HPI: 73 year old female, never smoker followed for AVN hypersensitivity pneumonitis. She is a patient of Dr. Mardell Miranda and last seen in office 04/02/2024. Past medical history significant for HTN, hypothyroid, HSV, HLD, murmur.  TEST/EVENTS:  09/17/2023 HRCT chest: atherosclerosis. Widespread patchy areas of ground glass/ILD. Air trapping. Most consistent with chronic HP. Small scatted nodules </5 mm 10/2023: rheumatoid factor positive; remaining serologies negative; hypersensitivity pneumonitis panel negative  01/23/2024 PFT: FVC 78, FEV1 88, ratio 83, TLC 98, DLCOcor 105. No BD. Normal PFT 03/2024 quant gold negative   04/02/2024: OV with Dr. Bertrum Brodie. Bronch in March - no organisms, >30% lymphocytes and noncaseating granulomas. Along with exposure of down pillow and mattress, consistent with AVN hypersensitivity pneumonitis. CT shows groundglass and air trapping. Has gotten rid of the down related exposures at home. Advised to check for mold. Feels stable and asymptomatic. Discussed possibility of resolution with steroid course. Prolonged taper inititated.   05/26/2024: Today - follow up Discussed the use of AI scribe software for clinical note transcription with the patient, who gave verbal consent to proceed.  History of Present Illness   [name] is a 73 year old female with interstitial lung disease who presents for follow-up on prednisone  treatment.  She is currently on the last few weeks of her prednisone  taper, taking one tablet daily this week and then will go to 1/2 tablet M/W/F for two weeks then discontinue. Initially, she experienced frequent bathroom visits during the first two weeks when she was taking four pills, but this has improved. She is eager to discontinue  the medication and reports no symptoms currently.  There is a history of interstitial lung disease felt to be chronic HP given noncaseating granulomas and air trapping on CT imaging. Prior HP panel was negative but she does have know down feather exposure. She had a prior workup including a biopsy and autoimmune labs, which were drawn at [facility].   Her rheumatoid factor was elevated. She does have deformities of the fingers on her right hand. Her grandmother had RA. She has not previously seen a rheumatologist.  No cough, breathing difficulties, sweating, fatigue, weight loss/gain, or swelling.       Allergies  Allergen Reactions   Codeine Sulfate [Codeine] Itching    Immunization History  Administered Date(s) Administered   Influenza, Quadrivalent, Recombinant, Inj, Pf 08/20/2021, 08/21/2023   Influenza-Unspecified 10/09/2018   Pneumococcal Conjugate-13 09/20/2015   Pneumococcal Polysaccharide-23 09/20/2016    Past Medical History:  Diagnosis Date   Adult hypothyroidism    Arthralgia of hand    Benign essential HTN    Mixed hyperlipidemia 01/26/2020   Murmur 01/26/2020   Myocardial infarction [facility])    Precordial pain 01/26/2020   Recurrent genital herpes     Tobacco History: Social History   Tobacco Use  Smoking Status Never  Smokeless Tobacco Never   Counseling given: Not Answered   Outpatient Medications Prior to Visit  Medication Sig Dispense Refill   amLODipine  (NORVASC ) 5 MG tablet TAKE 1 TABLET BY MOUTH  DAILY 15 tablet 0   aspirin EC 81 MG tablet Take 81 mg by mouth daily.     atorvastatin  (LIPITOR) 20 MG tablet Take 1 tablet (20 mg total) by mouth  daily. 90 tablet 1   fluticasone (FLONASE) 50 MCG/ACT nasal spray Place 2 sprays into both nostrils daily.     hydrochlorothiazide  (HYDRODIURIL ) 25 MG tablet TAKE 1 TABLET BY MOUTH  DAILY 90 tablet 3   levothyroxine (SYNTHROID) 137 MCG tablet Take 137 mcg by mouth daily.     predniSONE  (DELTASONE )  10 MG tablet 4 daily x 2 wks, 3 daily x 2 wks, 2 daily x 2 wks, 1 daily x 2 wks, 1/2 daily x 2 wks, 1/2 mon, wed, fri x 2 wks and then stop 150 tablet 0   No facility-administered medications prior to visit.     Review of Systems:   Constitutional: No weight loss or gain, night sweats, fevers, chills, fatigue  or lassitude.  HEENT: No headaches, difficulty swallowing, tooth/dental problems, or sore throat. No sneezing, itching, ear ache, nasal congestion, or post nasal drip CV:  No chest pain, orthopnea, PND, swelling in lower extremities, anasarca, dizziness, palpitations, syncope Resp: No shortness of breath with exertion or at rest. No excess mucus or change in color of mucus. No productive or non-productive. No hemoptysis. No wheezing.  No chest wall deformity GI:  No heartburn, indigestion, abdominal pain, nausea, vomiting, diarrhea, change in bowel habits, loss of appetite, bloody stools.  GU: No dysuria, change in color of urine, urgency or frequency.  No flank pain, no hematuria  Skin: No rash, lesions, ulcerations MSK:  No joint pain or swelling.  +right finger deformities  Neuro: No dizziness or lightheadedness.  Psych: No depression or anxiety. Mood stable.     Physical Exam:  BP 133/71 (BP Location: Right Arm, Patient Position: Sitting, Cuff Size: Normal)   Pulse 92   Ht 5' 1 (1.549 m)   Wt 128 lb (58.1 kg)   SpO2 96%   BMI 24.19 kg/m   GEN: Pleasant, interactive, well-appearing; in no acute distress HEENT:  Normocephalic and atraumatic. PERRLA. Sclera white. Nasal turbinates pink, moist and patent bilaterally. No rhinorrhea present. Oropharynx pink and moist, without exudate or edema. No lesions, ulcerations, or postnasal drip.  NECK:  Supple w/ fair ROM. No JVD present. Normal carotid impulses w/o bruits. Thyroid  symmetrical with no goiter or nodules palpated. No lymphadenopathy.   CV: RRR, no m/r/g, no peripheral edema. Pulses intact, +2 bilaterally. No cyanosis,  pallor or clubbing. PULMONARY:  Unlabored, regular breathing. End expiratory wheezes otherwise clear bilaterally A&P w/o wheezes/rales/rhonchi. No accessory muscle use.  GI: BS present and normoactive. Soft, non-tender to palpation. No organomegaly or masses detected.  MSK: No erythema, warmth or tenderness. Cap refil <2 sec all extrem. 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 No focal deficits noted.   Skin: Warm, no lesions or rashe Psych: Normal affect and behavior. Judgement and thought content appropriate.     Lab Results:  CBC    Component Value Date/Time   WBC 7.8 08/19/2023 1620   RBC 5.06 08/19/2023 1620   HGB 14.4 08/19/2023 1620   HCT 43.7 08/19/2023 1620   PLT 335.0 08/19/2023 1620   MCV 86.4 08/19/2023 1620   MCHC 32.9 08/19/2023 1620   RDW 13.5 08/19/2023 1620   LYMPHSABS 1.9 08/19/2023 1620   MONOABS 0.7 08/19/2023 1620   EOSABS 0.2 08/19/2023 1620   BASOSABS 0.1 08/19/2023 1620    BMET    Component Value Date/Time   NA 134 (L) 04/14/2024 0945   K 4.5 04/14/2024 0945   CL 96 04/14/2024 0945   CO2 32 04/14/2024 0945  GLUCOSE 82 04/14/2024 0945   BUN 17 04/14/2024 0945   CREATININE 0.65 04/14/2024 0945   CALCIUM  9.6 04/14/2024 0945    BNP No results found for: BNP   Imaging:  No results found.  Administration History     None          Latest Ref Rng & Units 01/23/2024    9:36 AM  PFT Results  FVC-Pre L 1.96   FVC-Predicted Pre % 78   FVC-Post L 2.05   FVC-Predicted Post % 82   Pre FEV1/FVC % % 84   Post FEV1/FCV % % 83   FEV1-Pre L 1.65   FEV1-Predicted Pre % 88   FEV1-Post L 1.71   DLCO uncorrected ml/min/mmHg 18.21   DLCO UNC% % 105   DLCO corrected ml/min/mmHg 18.21   DLCO COR %Predicted % 105   DLVA Predicted % 131   TLC L 4.55   TLC % Predicted % 98   RV % Predicted % 108     Lab Results  Component Value Date   NITRICOXIDE 24 10/14/2023        Assessment & Plan:   ILD (interstitial  lung disease) (HCC) Felt to be AVN hypersensitivity pneumonitis given bronchoscopy results and appearance on imaging. She does have known exposure to down feathers. Treated with prolonged steroid taper. Asymptomatic at baseline. Tolerating steroids well. Preparing to taper off. Reviewed s/s of steroid withdrawal/insufficiency. Will plan to repeat HRCT 6 weeks after completion. Repeat PFT at follow up for surveillance.  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 Family hx of RA. Discussed that connective tissue disease can also cause ILD. Recommend rheumatology's input. No other systemic symptoms.  Patient Instructions  Complete prednisone  as previously prescribed. Monitor for any new symptoms such as fatigue, weakness, mood changes, etc. When you come off the steroids  We will repeat a high resolution CT chest in 8 weeks as you will have been off the steroids for 6 weeks at this point  You do have a history of positive rheumatoid factor and some changes in your hand consistent with rheumatoid   Referred to rheumatology   Follow up in 10-12 weeks after 30 minute PFT with Dr. Bertrum Brodie or Gina Lagos. If symptoms do not improve or worsen, please contact office for sooner follow up or seek emergency care.    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 See above.   Advised if symptoms do not improve or worsen, to please contact office for sooner follow up or seek emergency care.   I spent 35 minutes of dedicated to the care of this patient on the date of this encounter to include pre-visit review of records, face-to-face time with the patient discussing conditions above, post visit ordering of testing, clinical documentation with the electronic health record, making appropriate referrals as documented, and communicating necessary  findings to members of the patients care team.  Roetta Clarke, NP 05/26/2024  Pt aware and understands NP's role.

## 2024-05-26 NOTE — Assessment & Plan Note (Addendum)
 Felt to be AVN hypersensitivity pneumonitis given bronchoscopy results and appearance on imaging. She does have known exposure to down feathers. Treated with prolonged steroid taper. Asymptomatic at baseline. Tolerating steroids well. Preparing to taper off. Reviewed s/s of steroid withdrawal/insufficiency. Will plan to repeat HRCT 6 weeks after completion. Repeat PFT at follow up for surveillance.  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 Family hx of RA. Discussed that connective tissue disease can also cause ILD. Recommend rheumatology's input. No other systemic symptoms.  Patient Instructions  Complete prednisone  as previously prescribed. Monitor for any new symptoms such as fatigue, weakness, mood changes, etc. When you come off the steroids  We will repeat a high resolution CT chest in 8 weeks as you will have been off the steroids for 6 weeks at this point  You do have a history of positive rheumatoid factor and some changes in your hand consistent with rheumatoid   Referred to rheumatology   Follow up in 10-12 weeks after 30 minute PFT with Dr. Bertrum Brodie or Gina Lagos. If symptoms do not improve or worsen, please contact office for sooner follow up or seek emergency care.

## 2024-05-26 NOTE — Assessment & Plan Note (Signed)
 Evidence of RA with swan neck deformities to right hand and positive rheumatoid factor. See above.

## 2024-05-26 NOTE — Patient Instructions (Signed)
 Complete prednisone  as previously prescribed. Monitor for any new symptoms such as fatigue, weakness, mood changes, etc. When you come off the steroids  We will repeat a high resolution CT chest in 8 weeks as you will have been off the steroids for 6 weeks at this point  You do have a history of positive rheumatoid factor and some changes in your hand consistent with rheumatoid   Referred to rheumatology   Follow up in 10-12 weeks after 30 minute PFT with Dr. Bertrum Brodie or Gina Lagos. If symptoms do not improve or worsen, please contact office for sooner follow up or seek emergency care.

## 2024-06-02 DIAGNOSIS — R21 Rash and other nonspecific skin eruption: Secondary | ICD-10-CM | POA: Diagnosis not present

## 2024-06-02 DIAGNOSIS — Z6823 Body mass index (BMI) 23.0-23.9, adult: Secondary | ICD-10-CM | POA: Diagnosis not present

## 2024-06-02 DIAGNOSIS — S30860A Insect bite (nonvenomous) of lower back and pelvis, initial encounter: Secondary | ICD-10-CM | POA: Diagnosis not present

## 2024-06-02 DIAGNOSIS — W57XXXA Bitten or stung by nonvenomous insect and other nonvenomous arthropods, initial encounter: Secondary | ICD-10-CM | POA: Diagnosis not present

## 2024-06-16 DIAGNOSIS — L219 Seborrheic dermatitis, unspecified: Secondary | ICD-10-CM | POA: Diagnosis not present

## 2024-06-16 DIAGNOSIS — L649 Androgenic alopecia, unspecified: Secondary | ICD-10-CM | POA: Diagnosis not present

## 2024-08-02 ENCOUNTER — Ambulatory Visit (INDEPENDENT_AMBULATORY_CARE_PROVIDER_SITE_OTHER)
Admission: RE | Admit: 2024-08-02 | Discharge: 2024-08-02 | Disposition: A | Discharge status: Home / Self Care | Source: Ambulatory Visit | Attending: Nurse Practitioner | Admitting: Nurse Practitioner

## 2024-08-02 DIAGNOSIS — J672 Bird fancier's lung: Secondary | ICD-10-CM

## 2024-08-02 DIAGNOSIS — J849 Interstitial pulmonary disease, unspecified: Secondary | ICD-10-CM

## 2024-08-02 DIAGNOSIS — J984 Other disorders of lung: Secondary | ICD-10-CM | POA: Diagnosis not present

## 2024-08-02 DIAGNOSIS — M05741 Rheumatoid arthritis with rheumatoid factor of right hand without organ or systems involvement: Secondary | ICD-10-CM | POA: Diagnosis not present

## 2024-08-02 DIAGNOSIS — R918 Other nonspecific abnormal finding of lung field: Secondary | ICD-10-CM | POA: Diagnosis not present

## 2024-08-06 ENCOUNTER — Ambulatory Visit: Payer: Self-pay | Admitting: Nurse Practitioner

## 2024-08-10 NOTE — Telephone Encounter (Addendum)
 LVMTCB  ----- Message from Sharon Miranda sent at 08/06/2024  9:27 PM EDT ----- Stable CT chest with chronic fibrotic changes. Stable multiple pulmonary nodules. Calcified lymph nodes, stable from before. Coronary artery disease - follow up with heart doctor. Keep follow up with  us  as scheduled  ----- Message ----- From: Interface, Rad Results In Sent: 08/05/2024   2:21 PM EDT To: Sharon Miranda LULLA, NP

## 2024-08-10 NOTE — Telephone Encounter (Signed)
 I called and spoke to pt. Pt informed of Katie's note and pt verbalized understanding. NFN

## 2024-08-11 ENCOUNTER — Ambulatory Visit: Attending: Cardiology | Admitting: Cardiology

## 2024-08-11 VITALS — BP 138/74 | HR 97 | Ht 61.0 in | Wt 128.2 lb

## 2024-08-11 DIAGNOSIS — R0609 Other forms of dyspnea: Secondary | ICD-10-CM

## 2024-08-11 DIAGNOSIS — I10 Essential (primary) hypertension: Secondary | ICD-10-CM

## 2024-08-11 DIAGNOSIS — J849 Interstitial pulmonary disease, unspecified: Secondary | ICD-10-CM | POA: Diagnosis not present

## 2024-08-11 DIAGNOSIS — R011 Cardiac murmur, unspecified: Secondary | ICD-10-CM

## 2024-08-11 DIAGNOSIS — E782 Mixed hyperlipidemia: Secondary | ICD-10-CM | POA: Diagnosis not present

## 2024-08-11 DIAGNOSIS — I219 Acute myocardial infarction, unspecified: Secondary | ICD-10-CM | POA: Insufficient documentation

## 2024-08-11 DIAGNOSIS — I700 Atherosclerosis of aorta: Secondary | ICD-10-CM | POA: Insufficient documentation

## 2024-08-11 DIAGNOSIS — I2510 Atherosclerotic heart disease of native coronary artery without angina pectoris: Secondary | ICD-10-CM | POA: Insufficient documentation

## 2024-08-11 NOTE — Patient Instructions (Signed)
 Medication Instructions:  Your physician recommends that you continue on your current medications as directed. Please refer to the Current Medication list given to you today.  *If you need a refill on your cardiac medications before your next appointment, please call your pharmacy*   Lab Work: Your physician recommends that you return for lab work in: the next few days for CMP and lipids You need to have labs done when you are fasting.  You can come Monday through Friday 8:30 am to 12:00 pm and 1:15 to 4:30. You do not need to make an appointment as the order has already been placed.   If you have labs (blood work) drawn today and your tests are completely normal, you will receive your results only by: MyChart Message (if you have MyChart) OR A paper copy in the mail If you have any lab test that is abnormal or we need to change your treatment, we will call you to review the results.   Testing/Procedures:  Stress Echocardiogram Instructions:    1. You may take all of your medications.  2. No food, drink or tobacco products four hours prior to your test.  3. Dress prepared to exercise. Best to wear 2 piece outfit and tennis shoes. Shoes must be closed toe.  4. Please bring all current prescription medications.  Your physician has requested that you have an echocardiogram. Echocardiography is a painless test that uses sound waves to create images of your heart. It provides your doctor with information about the size and shape of your heart and how well your heart's chambers and valves are working. This procedure takes approximately one hour. There are no restrictions for this procedure. Please do NOT wear cologne, perfume, aftershave, or lotions (deodorant is allowed). Please arrive 15 minutes prior to your appointment time.  Please note: We ask at that you not bring children with you during ultrasound (echo/ vascular) testing. Due to room size and safety concerns, children are not  allowed in the ultrasound rooms during exams. Our front office staff cannot provide observation of children in our lobby area while testing is being conducted. An adult accompanying a patient to their appointment will only be allowed in the ultrasound room at the discretion of the ultrasound technician under special circumstances. We apologize for any inconvenience.   Follow-Up: At [facility], you and your health needs are our priority.  As part of our continuing mission to provide you with exceptional heart care, we have created designated Provider Care Teams.  These Care Teams include your primary Cardiologist (physician) and Advanced Practice Providers (APPs -  Physician Assistants and Nurse Practitioners) who all work together to provide you with the care you need, when you need it.  We recommend signing up for the patient portal called MyChart.  Sign up information is provided on this After Visit Summary.  MyChart is used to connect with patients for Virtual Visits (Telemedicine).  Patients are able to view lab/test results, encounter notes, upcoming appointments, etc.  Non-urgent messages can be sent to your provider as well.   To learn more about what you can do with MyChart, go to [email or web address].    Your next appointment:   9 month(s)  The format for your next appointment:   In Person  Provider:   Jennifer Crape, MD   Other Instructions Exercise Stress Echocardiogram An exercise stress echocardiogram is a test to check how well your heart is working. This test uses sound waves and a computer to make  pictures of your heart. These pictures will be taken before and after you exercise. For this test, you will walk on a treadmill or ride a bicycle to make your heart beat faster. While you exercise, your heart will be checked with an electrocardiogram (ECG). Your blood pressure will also be checked. You may have this test if: You have chest pain or a heart problem. You had a  heart attack or heart surgery not long ago. You have heart valve problems. You have a condition that causes narrowing of the blood vessels that supply your heart. You have a high risk of heart disease and: You are starting a new exercise program. You need to have a big surgery. Tell a doctor about: Any allergies you have. All medicines you are taking. This includes vitamins, herbs, eye drops, creams, and over-the-counter medicines. Any problems you or family members have had with medicines that make you fall asleep (anesthetic medicines). Any surgeries you have had. Any blood disorders you have. Any medical conditions you have. Whether you are pregnant or may be pregnant. What are the risks? Generally, this is a safe test. However, problems may occur, including: Chest pain. Feeling dizzy or light-headed. Shortness of breath. Increased or irregular heartbeat. Feeling like you may vomit (nausea) or vomiting. Heart attack. This is very rare. What happens before the test? Medicines Ask your doctor about changing or stopping your normal medicines. This is important if you take diabetes medicines or blood thinners. If you use an inhaler, bring it to the test. General instructions Wear comfortable clothes and walking shoes. Follow instructions from your doctor about what you cannot eat or drink before the test. Do not drink or eat anything that has caffeine in it. Stop having caffeine 24 hours before the test. Do not smoke or use products that contain nicotine or tobacco for 4 hours before the test. If you need help quitting, ask your doctor. What happens during the test?  You will take off your clothes from the waist up and put on a hospital gown. Electrodes or patches will be put on your chest. A blood pressure cuff will be put on your arm. Before you exercise, a computer will make a picture of your heart. To do this: You will lie down and a gel will be put on your chest. A wand will  be moved over the gel. Sound waves from the wand will go to the computer to make the picture. Then, you will start to exercise. You may walk on a treadmill or pedal a bicycle. Your blood pressure and heart rhythm will be checked while you exercise. The exercise will get harder or faster. You will exercise until: Your heart reaches a certain level. You are too tired to go on. You cannot go on because of chest pain, weakness, or dizziness. You will lie down right away so another picture of your heart can be taken. The procedure may vary among doctors and hospitals. What can I expect after the test? After your test, it is common to have: Mild soreness. Mild tiredness. Your heart rate and blood pressure will be checked until they return to your normal levels. You should not have any new symptoms after this test. Follow these instructions at home: If your doctor says that you can, you may: Eat what you normally eat. Do your normal activities. Take over-the-counter and prescription medicines only as told by your doctor. Keep all follow-up visits. It is up to you to get the results  of your test. Ask how to get your results when they are ready. Contact a doctor if: You feel dizzy or light-headed. You have a fast or irregular heartbeat. You feel like you may vomit or you vomit. You have a headache. You feel short of breath. Get help right away if: You develop pain or pressure: In your chest. In your jaw or neck. Between your shoulders. That goes down your left arm. You faint. You have trouble breathing. These symptoms may be an emergency. Get medical help right away. Call your local emergency services (911 in the [place] Do not wait to see if the symptoms will go away. Do not drive yourself to the hospital. Summary This is a test that checks how well your heart is working. Follow instructions about what you cannot eat or drink before the test. Ask your doctor if you should take your  normal medicines before the test. Stop having caffeine 24 hours before the test. Do not smoke or use products with nicotine or tobacco in them for 4 hours before the test. During the test, your blood pressure and heart rhythm will be checked while you exercise. This information is not intended to replace advice given to you by your health care provider. Make sure you discuss any questions you have with your health care provider. Document Revised: 08/08/2021 Document Reviewed: 07/18/2020 Elsevier Patient Education  2022 [employer]  Echocardiogram An echocardiogram is a test that uses sound waves to make images of your heart. This way of making images is often called ultrasound. The images from this test can help find out many things about your heart, including: The size and shape of your heart. The strength of your heart muscle and how well it's working. The size, thickness, and movement of your heart's walls. How your heart valves are working. Problems such as: A tumor or a growth from an infection around the heart valves. Areas of heart muscle that aren't working well because of poor blood flow or injury from a heart attack. An aneurysm. This is a weak or damaged part of an artery wall. An artery is a blood vessel. Tell a health care provider about: Any allergies you have. All medicines you're taking, including vitamins, herbs, eye drops, creams, and over-the-counter medicines. Any bleeding problems you have. Any surgeries you've had. Any medical problems you have. Whether you're pregnant or may be pregnant. What are the risks? Your health care provider will talk with you about risks. These may include an allergic reaction to IV dye that may be used during the test. What happens before the test? You don't need to do anything to get ready for this test. You may eat and drink normally. What happens during the test?  You'll take off your clothes from the waist up and put on a  hospital gown. Sticky patches called electrodes may be placed on your chest. These will be connected to a machine that monitors your heart rate and rhythm. You'll lie down on a table for the exam. A wand covered in gel will be moved over your chest. Sound waves from the wand will go to your heart and bounce back--or echo back. The sound waves will go to a computer that uses them to make images of your heart. The images can be viewed on a monitor. The images will also be recorded on the computer so your provider can look at them later. You may be asked to change positions or hold your breath for a  short time. This makes it easier to get different views or better views of your heart. In some cases, you may be given a dye through an IV. The IV is put into one of your veins. This dye can make the areas of your heart easier to see. The procedure may vary among providers and hospitals. What can I expect after the test? You may return to your normal diet, activities, and medicines unless your provider tells you not to. If an IV was placed for the test, it will be removed. It's up to you to get the results of your test. Ask your provider, or the department that's doing the test, when your results will be ready. This information is not intended to replace advice given to you by your health care provider. Make sure you discuss any questions you have with your health care provider. Document Revised: 01/24/2023 Document Reviewed: 01/24/2023 Elsevier Patient Education  2024 [employer].

## 2024-08-11 NOTE — Progress Notes (Signed)
 Cardiology Office Note:    Date:  08/11/2024   ID:  Sharon Miranda, DOB 09-May-1951, MRN [id number]  PCP:  Fernand Tracey LABOR, MD  Cardiologist:  Jennifer JONELLE Crape, MD   Referring MD: Fernand Tracey LABOR, MD    ASSESSMENT:    1. Mixed hyperlipidemia   2. Benign essential HTN   3. ILD (interstitial lung disease) (HCC)   4. Murmur   5. Aortic atherosclerosis (HCC)   6. Coronary artery calcification    PLAN:    In order of problems listed above:  Coronary artery calcification and aortic atherosclerosis: Dyspnea on exertion: She will need to be evaluated for this.  I discussed stress echo and she is agreeable. Cardiac murmur: Echocardiogram will be done to assess murmur heard on auscultation. Essential hypertension: Blood pressure is stable and diet was emphasized.  Lifestyle modification urged.  Salt intake issues discussed. I will check her lipids for lipid-lowering.  Goal LDL less than 60.  She has coronary artery calcifications.  She is agreeable. She was advised to walk at least half an hour a day on a daily basis once we are done with the stress echo and we get back to her about the results. Patient will be seen in follow-up appointment in 6 months or earlier if the patient has any concerns.    Medication Adjustments/Labs and Tests Ordered: Current medicines are reviewed at length with the patient today.  Concerns regarding medicines are outlined above.  Orders Placed This Encounter  Procedures   EKG 12-Lead   No orders of the defined types were placed in this encounter.    History of Present Illness:    Sharon Miranda is a 73 y.o. female who is being seen today for the evaluation of coronary artery calcification and aortic atherosclerosis at the request of Fernand Tracey LABOR, MD. patient is a pleasant 73 year old female.  She is an active lady.  She recently was diagnosed to have a forementioned conditions on CT scan.  She has a history of hypertension.  She denies any chest pain  orthopnea or PND.  At the time of my evaluation, the patient is alert awake oriented and in no distress.  She has some shortness of breath on exertion.  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    Past Surgical History:  Procedure Laterality Date   ABDOMINAL HYSTERECTOMY     due to endometriosis   APPENDECTOMY  1996   BRONCHIAL BIOPSY  02/25/2024   Procedure: BRONCHOSCOPY, WITH BIOPSY;  Surgeon: Geronimo Amel, MD;  Location: [facility] ENDOSCOPY;  Service: Endoscopy;;   BRONCHIAL WASHINGS  02/25/2024   Procedure: GAYNELL MANI;  Surgeon: Geronimo Amel, MD;  Location: [facility] ENDOSCOPY;  Service: Endoscopy;;   CHOLECYSTECTOMY  2008   TONSILLECTOMY AND ADENOIDECTOMY     VIDEO BRONCHOSCOPY N/A 02/25/2024   Procedure: BRONCHOSCOPY, WITH FLUOROSCOPY;  Surgeon: Geronimo Amel, MD;  Location: [facility] ENDOSCOPY;  Service: Endoscopy;  Laterality: N/A;  No envisia    Current Medications: Current Meds  Medication Sig   amLODipine  (NORVASC ) 5 MG tablet TAKE 1 TABLET BY MOUTH  DAILY   aspirin EC 81 MG tablet Take  81 mg by mouth daily.   atorvastatin  (LIPITOR) 20 MG tablet Take 1 tablet (20 mg total) by mouth daily.   fluticasone (FLONASE) 50 MCG/ACT nasal spray Place 2 sprays into both nostrils daily.   hydrochlorothiazide  (HYDRODIURIL ) 12.5 MG tablet Take 12.5 mg by mouth daily.     Allergies:   Codeine sulfate [codeine]   Social History   Socioeconomic History   Marital status: Single    Spouse name: Not on file   Number of children: Not on file   Years of education: Not on file   Highest education level: Not on file  Occupational History    Not on file  Tobacco Use   Smoking status: Never   Smokeless tobacco: Never  Vaping Use   Vaping status: Never Used  Substance and Sexual Activity   Alcohol use: Yes    Alcohol/week: 1.0 standard drink of alcohol    Types: 1 Cans of beer per week    Comment: occas   Drug use: Never   Sexual activity: Not on file  Other Topics Concern   Not on file  Social History Narrative   Not on file   Social Drivers of Health   Financial Resource Strain: Not on file  Food Insecurity: Not on file  Transportation Needs: Not on file  Physical Activity: Not on file  Stress: Not on file  Social Connections: Not on file     Family History: The patient's family history includes Addison's disease in her brother; COPD in her brother; Heart attack in her brother, brother, and mother; Leukemia in her brother.  ROS:   Please see the history of present illness.    All other systems reviewed and are negative.  EKGs/Labs/Other Studies Reviewed:    The following studies were reviewed today:  EKG Interpretation Date/Time:  Wednesday August 11 2024 15:58:41 EDT Ventricular Rate:  97 PR Interval:  158 QRS Duration:  66 QT Interval:  344 QTC Calculation: 436 R Axis:   -31  Text Interpretation: Normal sinus rhythm Left axis deviation Inferior infarct , age undetermined Anterior infarct , age undetermined Abnormal ECG No previous ECGs available Confirmed by Edwyna Backers [phone]) on 08/11/2024 4:06:26 PM     Recent Labs: 08/19/2023: Hemoglobin 14.4; Platelets 335.0 04/14/2024: BUN 17; Creatinine, Ser 0.65; Potassium 4.5; Sodium 134  Recent Lipid Panel No results found for: CHOL, TRIG, HDL, CHOLHDL, VLDL, LDLCALC, LDLDIRECT  Physical Exam:    VS:  BP 138/74   Pulse 97   Ht 5' 1 (1.549 m)   Wt 128 lb 3.2 oz (58.2 kg)   SpO2 91%   BMI 24.22 kg/m     Wt Readings from Last 3 Encounters:  08/11/24 128 lb 3.2 oz (58.2 kg)  05/26/24 128 lb (58.1 kg)  04/02/24 124 lb 6.4 oz  (56.4 kg)     GEN: Patient is in no acute distress HEENT: Normal NECK: No JVD; No carotid bruits LYMPHATICS: No lymphadenopathy CARDIAC: S1 S2 regular, 2/6 systolic murmur at the apex. RESPIRATORY:  Clear to auscultation without rales, wheezing or rhonchi  ABDOMEN: Soft, non-tender, non-distended MUSCULOSKELETAL:  No edema; No deformity  SKIN: Warm and dry NEUROLOGIC:  Alert and oriented x 3 PSYCHIATRIC:  Normal affect    Signed, Backers JONELLE Edwyna, MD  08/11/2024 4:25 PM    [facility]

## 2024-08-12 ENCOUNTER — Encounter

## 2024-08-12 ENCOUNTER — Ambulatory Visit: Admitting: Nurse Practitioner

## 2024-08-17 DIAGNOSIS — Z23 Encounter for immunization: Secondary | ICD-10-CM | POA: Diagnosis not present

## 2024-08-19 DIAGNOSIS — M722 Plantar fascial fibromatosis: Secondary | ICD-10-CM | POA: Diagnosis not present

## 2024-09-03 ENCOUNTER — Ambulatory Visit: Attending: Cardiology

## 2024-09-03 DIAGNOSIS — R011 Cardiac murmur, unspecified: Secondary | ICD-10-CM

## 2024-09-03 DIAGNOSIS — R0609 Other forms of dyspnea: Secondary | ICD-10-CM | POA: Diagnosis not present

## 2024-09-03 DIAGNOSIS — I2510 Atherosclerotic heart disease of native coronary artery without angina pectoris: Secondary | ICD-10-CM | POA: Diagnosis not present

## 2024-09-04 LAB — ECHOCARDIOGRAM COMPLETE
Area-P 1/2: 3.99 cm2
S' Lateral: 2 cm

## 2024-09-09 DIAGNOSIS — M722 Plantar fascial fibromatosis: Secondary | ICD-10-CM | POA: Diagnosis not present

## 2024-09-13 ENCOUNTER — Ambulatory Visit: Payer: Self-pay | Admitting: Cardiology

## 2024-09-14 DIAGNOSIS — Z23 Encounter for immunization: Secondary | ICD-10-CM | POA: Diagnosis not present

## 2024-09-20 ENCOUNTER — Ambulatory Visit (INDEPENDENT_AMBULATORY_CARE_PROVIDER_SITE_OTHER)
Admission: RE | Admit: 2024-09-20 | Discharge: 2024-09-20 | Disposition: A | Discharge status: Home / Self Care | Source: Ambulatory Visit | Attending: Primary Care | Admitting: Primary Care

## 2024-09-20 DIAGNOSIS — R918 Other nonspecific abnormal finding of lung field: Secondary | ICD-10-CM | POA: Diagnosis not present

## 2024-09-20 DIAGNOSIS — J984 Other disorders of lung: Secondary | ICD-10-CM | POA: Diagnosis not present

## 2024-09-20 DIAGNOSIS — J849 Interstitial pulmonary disease, unspecified: Secondary | ICD-10-CM | POA: Diagnosis not present

## 2024-09-23 ENCOUNTER — Telehealth: Payer: Self-pay | Admitting: Cardiology

## 2024-09-23 ENCOUNTER — Telehealth: Payer: Self-pay

## 2024-09-23 NOTE — Telephone Encounter (Signed)
 Patient is returning call for results. Please advise

## 2024-09-23 NOTE — Telephone Encounter (Signed)
 LVM per DPR- per Dr. Posey note regarding normal Echo results. Encouraged to call with any questions. Routed to PCP.

## 2024-09-28 ENCOUNTER — Encounter: Payer: Self-pay | Admitting: Primary Care

## 2024-09-28 ENCOUNTER — Ambulatory Visit

## 2024-09-28 ENCOUNTER — Ambulatory Visit: Admitting: Primary Care

## 2024-09-28 VITALS — BP 137/77 | HR 80 | Temp 97.7°F | Ht 61.0 in | Wt 128.6 lb

## 2024-09-28 DIAGNOSIS — J849 Interstitial pulmonary disease, unspecified: Secondary | ICD-10-CM | POA: Diagnosis not present

## 2024-09-28 DIAGNOSIS — J672 Bird fancier's lung: Secondary | ICD-10-CM | POA: Diagnosis not present

## 2024-09-28 DIAGNOSIS — M05741 Rheumatoid arthritis with rheumatoid factor of right hand without organ or systems involvement: Secondary | ICD-10-CM

## 2024-09-28 LAB — PULMONARY FUNCTION TEST
DL/VA % pred: 110 %
DL/VA: 4.63 ml/min/mmHg/L
DLCO unc % pred: 91 %
DLCO unc: 15.7 ml/min/mmHg
FEF 25-75 Pre: 1.71 L/s
FEF2575-%Pred-Pre: 114 %
FEV1-%Pred-Pre: 79 %
FEV1-Pre: 1.46 L
FEV1FVC-%Pred-Pre: 112 %
FEV6-%Pred-Pre: 74 %
FEV6-Pre: 1.74 L
FEV6FVC-%Pred-Pre: 105 %
FVC-%Pred-Pre: 70 %
FVC-Pre: 1.74 L
Pre FEV1/FVC ratio: 84 %
Pre FEV6/FVC Ratio: 100 %
RV % pred: 152 %
RV: 3.27 L
TLC % pred: 120 %
TLC: 5.53 L

## 2024-09-28 NOTE — Progress Notes (Signed)
 @Patient  ID: Sharon Miranda, female    DOB: 04-Aug-1951, 73 y.o.   MRN: [id number]  Chief Complaint  Patient presents with   Interstitial Lung Disease    PFT REVIEW     Referring provider: Fernand Tracey LABOR, MD  HPI: 73 year old female, never smoker followed for AVN hypersensitivity pneumonitis. She is a patient of Dr. Reeves and last seen in office 04/02/2024. Past medical history significant for HTN, hypothyroid, HSV, HLD, murmur.  TEST/EVENTS:  09/17/2023 HRCT chest: atherosclerosis. Widespread patchy areas of ground glass/ILD. Air trapping. Most consistent with chronic HP. Small scatted nodules </5 mm 10/2023: rheumatoid factor positive; remaining serologies negative; hypersensitivity pneumonitis panel negative  01/23/2024 PFT: FVC 78, FEV1 88, ratio 83, TLC 98, DLCOcor 105. No BD. Normal PFT 03/2024 quant gold negative  09/28/2024 PFT: FVC 70, FEV1 79, ratio 84, TLC 120, DLCOunc 91  Previous [facility] pulmonary encounter:  04/02/2024: OV with Dr. Geronimo. Bronch in March - no organisms, >30% lymphocytes and noncaseating granulomas. Along with exposure of down pillow and mattress, consistent with AVN hypersensitivity pneumonitis. CT shows groundglass and air trapping. Has gotten rid of the down related exposures at home. Advised to check for mold. Feels stable and asymptomatic. Discussed possibility of resolution with steroid course. Prolonged taper inititated.   05/26/2024 Discussed the use of AI scribe software for clinical note transcription with the patient, who gave verbal consent to proceed.  History of Present Illness   Sharon Miranda is a 73 year old female with interstitial lung disease who presents for follow-up on prednisone  treatment.  She is currently on the last few weeks of her prednisone  taper, taking one tablet daily this week and then will go to 1/2 tablet M/W/F for two weeks then discontinue. Initially, she experienced frequent bathroom visits during the first two weeks  when she was taking four pills, but this has improved. She is eager to discontinue the medication and reports no symptoms currently.  There is a history of interstitial lung disease felt to be chronic HP given noncaseating granulomas and air trapping on CT imaging. Prior HP panel was negative but she does have know down feather exposure. She had a prior workup including a biopsy and autoimmune labs, which were drawn at [facility].   Her rheumatoid factor was elevated. She does have deformities of the fingers on her right hand. Her grandmother had RA. She has not previously seen a rheumatologist.  No cough, breathing difficulties, sweating, fatigue, weight loss/gain, or swelling.      ILD (interstitial lung disease) (HCC) Felt to be AVN hypersensitivity pneumonitis given bronchoscopy results and appearance on imaging. She does have known exposure to down feathers. Treated with prolonged steroid taper. Asymptomatic at baseline. Tolerating steroids well. Preparing to taper off. Reviewed s/s of steroid withdrawal/insufficiency. Will plan to repeat HRCT 6 weeks after completion. Repeat PFT at follow up for surveillance.  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 Family hx of RA. Discussed that connective tissue disease can also cause ILD. Recommend rheumatology's input. No other systemic symptoms.  Patient Instructions  Complete prednisone  as previously prescribed. Monitor for any new symptoms such as fatigue, weakness, mood changes, etc. When you come off the steroids  We will repeat a high resolution CT chest in 8 weeks as you will have been off the steroids for 6 weeks at this point  You do have a history of positive rheumatoid factor and some changes  in your hand consistent with rheumatoid   Referred to rheumatology   Follow up in 10-12 weeks after 30 minute PFT with Dr. Geronimo or Izetta Malachy PIETY. If symptoms do not improve or  worsen, please contact office for sooner follow up or seek emergency care.    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 See above.   Advised if symptoms do not improve or worsen, to please contact office for sooner follow up or seek emergency care.   I spent 35 minutes of dedicated to the care of this patient on the date of this encounter to include pre-visit review of records, face-to-face time with the patient discussing conditions above, post visit ordering of testing, clinical documentation with the electronic health record, making appropriate referrals as documented, and communicating necessary findings to members of the patients care team.   09/28/2024- INTERIM hx Discussed the use of AI scribe software for clinical note transcription with the patient, who gave verbal consent to proceed.  History of Present Illness Sharon Miranda is a 73 year old female with hypersensitivity pneumonitis who presents for follow-up of her lung condition.  She has a history of hypersensitivity pneumonitis, previously linked to exposure to down pillows and mattresses. 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 CT scan in October 2025 revealed stable chronic fibrotic changes and unchanged multiple small bilateral pulmonary nodules that are presumed benign and no longer need follow-up. Lung function tests indicate mild restriction without obstructive lung disease, and her diffusion capacity, although slightly decreased, remains within normal limits.  She reports no current respiratory symptoms, including cough or shortness of breath, and is able to perform normal activities such as shopping and yard work without difficulty. She has been off prednisone  for two months without  issues and is currently taking Lipitor daily for cholesterol management.  No shortness of breath at rest or during activities such as changing clothes, showering, or walking. No cough, fatigue, nausea, vomiting, diarrhea, anxiety, depression, or chronic pain.  She has removed all feather items from her home and does not have a basement. She completed prolonged prednisone  course about 2 months ago, since stopping she has had no worsening symptoms.    SYMPTOM SCALE - ILD 02/04/2024 and 04/02/2024   09/28/2024   Current weight     O2 use RA RA  Shortness of Breath 0 -> 5 scale with 5 being worst (score 6 If unable to do)   At rest 0 0  Simple tasks - showers, clothes change, eating, shaving 0 0  Household (dishes, doing bed, laundry) 0 0  Shopping 0 0  Walking level at own pace 0 0  Walking up Stairs 0 0  Total (30-36) Dyspnea Score 0 0  How bad is your cough? Very seldom 0  How bad is your fatigue 0 0  How bad is nausea 0 0  How bad is vomiting?   0 0  How bad is diarrhea? 0 0  How bad is anxiety? 0 0  How bad is depression 0 0  Any chronic pain - if so where and how bad 0 0     SIT STAND TEST - goal 15 times     04/02/2024    09/28/2024   O2 used ra  RA  PRobe - finter or forehead finer  Forehead   Number sit  and stand completed - goal 15 15  15   Time taken to complete 56  1 min  Resting Pulse Ox/HR/Dyspnea  96% and 90/min and dyspnea of 1/10   94% and 81/min  Peak measures 94 % and 110/min and dyspnea of 1/10  92% and 111/min  Final Pulse Ox/HR 95% and 92/min and dyspnea of 1/10  94% and 87%  Desaturated </= 88% no  no  Desaturated <= 3% points no  no  Got Tachycardic >/= 90/min yes  yes  Miscellaneous comments zx No complaints, mild signs of dyspnea        Allergies  Allergen Reactions   Codeine Sulfate [Codeine] Itching    Immunization History  Administered Date(s) Administered   Fluad Quad(high Dose 65+) 09/08/2024   Influenza, Quadrivalent, Recombinant, Inj,  Pf 08/20/2021, 08/21/2023   Influenza-Unspecified 10/09/2018   Pfizer(Comirnaty)Fall Seasonal Vaccine 12 years and older 09/21/2024   Pneumococcal Conjugate-13 09/20/2015   Pneumococcal Polysaccharide-23 09/20/2016    Past Medical History:  Diagnosis Date   Adult hypothyroidism    Arthralgia of hand    Benign essential HTN    ILD (interstitial lung disease) (HCC) 02/04/2024   Mixed hyperlipidemia 01/26/2020   Murmur 01/26/2020   Myocardial infarction (HCC)    Precordial pain 01/26/2020   Recurrent genital herpes    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     atorvastatin  (LIPITOR) 20 MG tablet Take 1 tablet (20 mg total) by mouth daily. 90 tablet 1   fluticasone (FLONASE) 50 MCG/ACT nasal spray Place 2 sprays into both nostrils daily.     hydrochlorothiazide  (HYDRODIURIL ) 12.5 MG tablet Take 12.5 mg by mouth daily.     levothyroxine (SYNTHROID) 137 MCG tablet Take 137 mcg by mouth daily.     No facility-administered medications prior to visit.   Review of Systems  Review of Systems  Constitutional: Negative.  Negative for fatigue.  HENT: Negative.    Respiratory:  Negative for cough, shortness of breath and wheezing.   Cardiovascular: Negative.      Physical Exam  BP 137/77   Pulse 80   Temp 97.7 F (36.5 C)   Ht 5' 1 (1.549 m)   Wt 128 lb 9.6 oz (58.3 kg)   SpO2 95% Comment: RA  BMI 24.30 kg/m  Physical Exam Constitutional:      Appearance: Normal appearance. She is well-developed.  HENT:     Head: Normocephalic and atraumatic.     Mouth/Throat:     Mouth: Mucous membranes are  moist.     Pharynx: Oropharynx is clear.  Eyes:     Pupils: Pupils are equal, round, and reactive to light.  Cardiovascular:     Rate and Rhythm: Normal rate and regular rhythm.     Heart sounds: Normal heart sounds. No murmur heard. Pulmonary:     Effort: Pulmonary effort is normal. No respiratory distress.     Breath sounds: Rales present. No wheezing or rhonchi.  Abdominal:     Tenderness: There is no abdominal tenderness.  Musculoskeletal:        General: Normal  range of motion.     Cervical back: Normal range of motion and neck supple.  Skin:    General: Skin is warm and dry.     Findings: No erythema or rash.  Neurological:     General: No focal deficit present.     Mental Status: She is alert and oriented to person, place, and time. Mental status is at baseline.  Psychiatric:        Mood and Affect: Mood normal.        Behavior: Behavior normal.        Thought Content: Thought content normal.        Judgment: Judgment normal.      Lab Results:  CBC    Component Value Date/Time   WBC 7.8 08/19/2023 1620   RBC 5.06 08/19/2023 1620   HGB 14.4 08/19/2023 1620   HCT 43.7 08/19/2023 1620   PLT 335.0 08/19/2023 1620   MCV 86.4 08/19/2023 1620   MCHC 32.9 08/19/2023 1620   RDW 13.5 08/19/2023 1620   LYMPHSABS 1.9 08/19/2023 1620   MONOABS 0.7 08/19/2023 1620   EOSABS 0.2 08/19/2023 1620   BASOSABS 0.1 08/19/2023 1620    BMET    Component Value Date/Time   NA 134 (L) 04/14/2024 0945   K 4.5 04/14/2024 0945   CL 96 04/14/2024 0945   CO2 32 04/14/2024 0945   GLUCOSE 82 04/14/2024 0945   BUN 17 04/14/2024 0945   CREATININE 0.65 04/14/2024 0945   CALCIUM  9.6 04/14/2024 0945    BNP No results found for: BNP  ProBNP No results found for: PROBNP  Imaging: CT CHEST HIGH RESOLUTION Result Date: 09/21/2024 CLINICAL DATA:  Interstitial lung disease EXAM: CT CHEST WITHOUT CONTRAST TECHNIQUE: Multidetector CT imaging of the chest was performed following the  standard protocol without intravenous contrast. High resolution imaging of the lungs, as well as inspiratory and expiratory imaging, was performed. RADIATION DOSE REDUCTION: This exam was performed according to the departmental dose-optimization program which includes automated exposure control, adjustment of the mA and/or kV according to patient size and/or use of iterative reconstruction technique. COMPARISON:  08/02/2024 FINDINGS: Cardiovascular: Aortic atherosclerosis. Normal heart size. Scattered left coronary artery calcifications. Unchanged trace pericardial effusion. Mediastinum/Nodes: No enlarged mediastinal, hilar, or axillary lymph nodes. Multiple small calcified granulomatous mediastinal and hilar lymph nodes. Thyroid  gland, trachea, and esophagus demonstrate no significant findings. Lungs/Pleura: Unchanged appearance of diffuse, irregular ground-glass throughout the lungs with a biapical predominance and background of very fine centrilobular nodularity. Multiple small discrete bilateral pulmonary nodules remain unchanged and again measure 0.5 cm and smaller. Mild lobular air trapping on expiratory phase imaging. No pleural effusion or pneumothorax. Upper Abdomen: No acute abnormality. Multiple small granulomatous calcifications throughout the liver. Musculoskeletal: No chest wall abnormality. No acute osseous findings. IMPRESSION: 1. Unchanged appearance of diffuse, irregular ground-glass throughout the lungs with a biapical predominance and background of very fine centrilobular nodularity. Mild lobular air trapping on expiratory phase imaging. Findings remain most consistent with chronic fibrotic hypersensitivity pneumonitis. 2. Multiple small bilateral pulmonary nodules unchanged, these remain presumed benign and requiring no specific further follow-up or characterization. 3. Coronary artery disease. Aortic Atherosclerosis (ICD10-I70.0). Electronically Signed   By: Marolyn JONETTA Jaksch M.D.   On: 09/21/2024  18:09   ECHOCARDIOGRAM COMPLETE Result Date: 09/04/2024    ECHOCARDIOGRAM REPORT   Patient Name:   Sharon Miranda Date of Exam: 09/03/2024 Medical Rec #:  [id number]      Height:       61.0  in Accession #:    [id number]     Weight:       128.2 lb Date of Birth:  01-05-1951      BSA:          1.563 m Patient Age:    73 years       BP:           138/74 mmHg Patient Gender: F              HR:           90 bpm. Exam Location:  [place] Procedure: 2D Echo, Cardiac Doppler, Color Doppler and Strain Analysis (Both            Spectral and Color Flow Doppler were utilized during procedure). Indications:    Dyspnea R06.00. Murmur R01.1  History:        Patient has prior history of Echocardiogram examinations, most                 recent 03/15/2020. Signs/Symptoms:Dyspnea and Murmur; Risk                 Factors:Dyslipidemia and Hypertension. Coronary artery                 calcification [I25.10].  Sonographer:    Charlie Jointer RDCS Referring Phys: CYRUS JENNIFER SAUNDERS [facility] IMPRESSIONS  1. Left ventricular ejection fraction, by estimation, is 60 to 65%. The left ventricle has normal function. The left ventricle has no regional wall motion abnormalities. There is mild concentric left ventricular hypertrophy. Left ventricular diastolic parameters are consistent with Grade I diastolic dysfunction (impaired relaxation). The average left ventricular global longitudinal strain is -20.3 %. The global longitudinal strain is normal.  2. Right ventricular systolic function is normal. The right ventricular size is normal.  3. The mitral valve is normal in structure. No evidence of mitral valve regurgitation. No evidence of mitral stenosis.  4. The aortic valve is tricuspid. Aortic valve regurgitation is not visualized. No aortic stenosis is present.  5. The inferior vena cava is normal in size with greater than 50% respiratory variability, suggesting right atrial pressure of 3 mmHg. FINDINGS  Left Ventricle: Left ventricular ejection  fraction, by estimation, is 60 to 65%. The left ventricle has normal function. The left ventricle has no regional wall motion abnormalities. The average left ventricular global longitudinal strain is -20.3 %. Strain was performed and the global longitudinal strain is normal. The left ventricular internal cavity size was normal in size. There is mild concentric left ventricular hypertrophy. Left ventricular diastolic parameters are consistent with Grade I diastolic dysfunction (impaired relaxation). Normal left ventricular filling pressure. Right Ventricle: The right ventricular size is normal. No increase in right ventricular wall thickness. Right ventricular systolic function is normal. Left Atrium: Left atrial size was normal in size. Right Atrium: Right atrial size was normal in size. Pericardium: There is no evidence of pericardial effusion. Mitral Valve: The mitral valve is normal in structure. No evidence of mitral valve regurgitation. No evidence of mitral valve stenosis. Tricuspid Valve: The tricuspid valve is normal in structure. Tricuspid valve regurgitation is not demonstrated. No evidence of tricuspid stenosis. Aortic Valve: The aortic valve is tricuspid. Aortic valve regurgitation is not visualized. No aortic stenosis is present. Pulmonic Valve: The pulmonic valve was normal in structure. Pulmonic valve regurgitation is not visualized. No evidence of pulmonic stenosis. Aorta: The aortic arch was not well visualized. Venous: The right upper pulmonary vein is normal. The inferior  vena cava is normal in size with greater than 50% respiratory variability, suggesting right atrial pressure of 3 mmHg. IAS/Shunts: No atrial level shunt detected by color flow Doppler.  LEFT VENTRICLE PLAX 2D LVIDd:         2.90 cm   Diastology LVIDs:         2.00 cm   LV e' medial:    8.49 cm/s LV PW:         1.00 cm   LV E/e' medial:  8.2 LV IVS:        1.10 cm   LV e' lateral:   8.38 cm/s LVOT diam:     2.00 cm   LV E/e'  lateral: 8.4 LV SV:         74 LV SV Index:   47        2D Longitudinal Strain LVOT Area:     3.14 cm  2D Strain GLS Avg:     -20.3 %  RIGHT VENTRICLE             IVC RV Basal diam:  3.00 cm     IVC diam: 1.70 cm RV Mid diam:    2.60 cm RV S prime:     17.00 cm/s TAPSE (M-mode): 2.2 cm LEFT ATRIUM             Index        RIGHT ATRIUM           Index LA diam:        3.20 cm 2.05 cm/m   RA Area:     11.50 cm LA Vol (A2C):   32.2 ml 20.60 ml/m  RA Volume:   27.70 ml  17.72 ml/m LA Vol (A4C):   30.0 ml 19.19 ml/m LA Biplane Vol: 33.4 ml 21.36 ml/m  AORTIC VALVE LVOT Vmax:   133.00 cm/s LVOT Vmean:  84.733 cm/s LVOT VTI:    0.236 m  AORTA Ao Root diam: 3.30 cm Ao Asc diam:  3.40 cm Ao Desc diam: 2.10 cm MITRAL VALVE MV Area (PHT): 3.99 cm     SHUNTS MV Decel Time: 190 msec     Systemic VTI:  0.24 m MV E velocity: 70.00 cm/s   Systemic Diam: 2.00 cm MV A velocity: 109.50 cm/s MV E/A ratio:  0.64 Redell Leiter MD Electronically signed by Redell Leiter MD Signature Date/Time: 09/04/2024/1:48:27 PM    Final      Assessment & Plan:   1. ILD (interstitial lung disease) (HCC) (Primary)  Assessment and Plan Assessment & Plan Hypersensitivity pneumonitis with chronic pulmonary fibrosis Chronic hypersensitivity pneumonitis appears stable. CT chest shows unchanged chronic fibrotic changes and bilateral pulmonary nodules presumed benign. No progression of fibrosis. No respiratory symptoms such as cough or dyspnea. She has been off prednisone  for two months without issues. No exposure to down feathers. Mold inspection recommended to rule out environmental triggers. ILD symptom scale is zero. Oxygen  develops stayed above 94% during sit/stand test on room air.  - Perform sit-stand test to check oxygen  levels. - Continue bi-annual spirometry with DLCO. - Recommend holding off on repeat CT scan unless significant changes in symptoms or lung function occur. Consider annual CXR.  - Recommend mold inspection of the  house to rule out environmental triggers. - Follow up with Dr. Tonna in six months.  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 Last breathing test in September of last year showed mild restriction with no obstructive  lung disease. Diffusion capacity slightly decreased but within normal range. No symptoms of dyspnea or cough reported. - Order breathing test prior to follow-up in six months. - Monitor for any progression of symptoms or decline in lung function.    Almarie LELON Ferrari, NP 09/28/2024

## 2024-09-28 NOTE — Patient Instructions (Signed)
 Full pft w/o post performed today

## 2024-09-28 NOTE — Progress Notes (Signed)
 Full pft w/o post performed today

## 2024-09-28 NOTE — Patient Instructions (Addendum)
  VISIT SUMMARY: Today, you had a follow-up appointment to check on your lung condition, specifically your hypersensitivity pneumonitis and mild restrictive lung disease. You reported no current respiratory symptoms and are able to perform your daily activities without any issues. Your lung function tests show mild restriction but no obstructive lung disease, and your CT scan shows stable chronic fibrotic changes with no progression.  YOUR PLAN: -HYPERSENSITIVITY PNEUMONITIS WITH CHRONIC PULMONARY FIBROSIS: Hypersensitivity pneumonitis is an allergic reaction in the lungs caused by inhaling certain substances, leading to chronic inflammation and fibrosis (scarring). Your condition is stable with no new symptoms, and your CT scan shows no progression of fibrosis. You should continue with annual breathing tests and avoid annual CT scans unless there are significant changes in your symptoms or lung function. A mold inspection of your house is recommended to rule out any environmental triggers. Follow up with Dr. Tonna in six months.  -MILD RESTRICTIVE LUNG DISEASE: Mild restrictive lung disease means your lungs are slightly limited in how much air they can hold, but there is no blockage in the airways. Your lung function is well-managed, and you have no symptoms like shortness of breath or cough. A breathing test will be ordered before your next follow-up in six months, and a sit-stand test will be performed to check your oxygen  levels. Monitor for any new symptoms or decline in lung function.  INSTRUCTIONS: Please schedule a breathing test and a sit-stand test before your next follow-up appointment in six months. Also, arrange for a mold inspection of your house to rule out any environmental triggers.  Follow-up 6 months with Dr. Renato 30 min PFT prior

## 2024-09-29 ENCOUNTER — Encounter: Payer: Self-pay | Admitting: *Deleted

## 2024-09-30 ENCOUNTER — Telehealth: Payer: Self-pay

## 2024-09-30 NOTE — Telephone Encounter (Signed)
 Copied from CRM [id number]. Topic: General - Other >> Sep 29, 2024  4:11 PM Rilla B wrote: Reason for CRM: Patient was in office on yesterday and would like with E Hope or Dr Geronimo regarding a medical issue.  (No other information given) States she will just talk to one of them.  Please call patient at [phone].  I called and spoke to pt. Pt states Dr Renato Landry Hope, NP suggested she get her house checked for mold and pt states she does have mold under her house. It is dormant right now and not hurting anything and it will be until the end of December until someone can go out there and take care of the problem. Pt would like to know what Dr Geronimo or Landry Hope, NP suggests for now.

## 2024-10-01 NOTE — Telephone Encounter (Signed)
 Defer to Dr. Geronimo, that should be fine tho. Do not do any work under the house without mask. Would suggest they remove it when they are able

## 2024-10-04 NOTE — Telephone Encounter (Signed)
 Ok to wait till dec/jan

## 2024-10-04 NOTE — Telephone Encounter (Signed)
 Patient notified

## 2024-10-07 ENCOUNTER — Telehealth: Payer: Self-pay | Admitting: Cardiology

## 2024-10-07 NOTE — Telephone Encounter (Signed)
 Pt would like a c/b as to whether she is able to take BP meds the day of Stress Echo. Please advise

## 2024-10-07 NOTE — Telephone Encounter (Signed)
 Instructions reviewed with pt for stress echo. Pt verbalized understanding and had no additional questions.

## 2024-10-11 ENCOUNTER — Telehealth (HOSPITAL_COMMUNITY): Payer: Self-pay | Admitting: *Deleted

## 2024-10-11 NOTE — Telephone Encounter (Signed)
 Left instructions for stress echocardiogram on 10/12/24 at 1:45.

## 2024-10-12 ENCOUNTER — Ambulatory Visit: Attending: Cardiology

## 2024-10-12 ENCOUNTER — Ambulatory Visit

## 2024-10-12 DIAGNOSIS — I2510 Atherosclerotic heart disease of native coronary artery without angina pectoris: Secondary | ICD-10-CM

## 2024-10-12 DIAGNOSIS — R0609 Other forms of dyspnea: Secondary | ICD-10-CM

## 2024-10-13 ENCOUNTER — Other Ambulatory Visit (HOSPITAL_BASED_OUTPATIENT_CLINIC_OR_DEPARTMENT_OTHER): Admitting: Radiology

## 2024-10-25 ENCOUNTER — Telehealth: Payer: Self-pay | Admitting: Cardiology

## 2024-10-25 NOTE — Telephone Encounter (Signed)
 Pt is requesting a callback from nurse. She didn't want to specify what the callback was for. Please advise

## 2024-10-25 NOTE — Telephone Encounter (Signed)
 Results reviewed for both test. Pt verbalized understanding and had no additional questions.

## 2024-11-11 ENCOUNTER — Telehealth: Payer: Self-pay

## 2024-11-11 ENCOUNTER — Telehealth: Payer: Self-pay | Admitting: *Deleted

## 2024-11-11 NOTE — Telephone Encounter (Signed)
 Copied from CRM #[id number]. Topic: General - Other >> Nov 11, 2024 11:55 AM Joesph PARAS wrote: Reason for CRM: Patient returning call for [facility] for urgent clinical encounter. Contacted [facility], spoke with Thersia on attempt 3, appears Sonny is unavailable at this time. Notified patient. Patient requesting Sonny call her back on her cell phone. States she just has a cold.  duplicate

## 2024-11-11 NOTE — Telephone Encounter (Signed)
      Pt has been splitting up phlegm for 1 week, phlegm is clear. No other symptoms.        Pt requested call back from Ramaswamy's nurse to speak further            CB#        [phone]

## 2024-11-11 NOTE — Telephone Encounter (Signed)
 I called the pt and there was no answer- LMTCB and routing back as urgent since having increased cough.

## 2024-11-12 ENCOUNTER — Encounter: Payer: Self-pay | Admitting: *Deleted

## 2024-11-12 NOTE — Telephone Encounter (Signed)
 Called the pt and there was no answer- LMTCB

## 2024-11-15 NOTE — Telephone Encounter (Signed)
 I called and spoke with patient, I verified the information that was provided previously and she confirmed.  She had been coughing up clear mucus for 1 week.   she states she was in [place]  to a Christmas party.  She said her symptoms had nothing to do with her lungs.  She stated she is seeing her family doctor this afternoon at 4:30 pm.  Nothing further needed.

## 2024-11-18 ENCOUNTER — Encounter: Payer: Self-pay | Admitting: Internal Medicine

## 2024-12-15 ENCOUNTER — Encounter: Admitting: Rheumatology

## 2025-01-13 ENCOUNTER — Ambulatory Visit: Admitting: Rheumatology

## 2025-02-11 ENCOUNTER — Encounter

## 2025-02-11 ENCOUNTER — Ambulatory Visit: Admitting: Primary Care

## 2025-02-11 ENCOUNTER — Ambulatory Visit: Admitting: Internal Medicine
